# Patient Record
Sex: Male | Born: 1950
Health system: Southern US, Community
[De-identification: ages and names within clinical notes are randomized; demographics above are authoritative.]

## PROBLEM LIST (undated history)

## (undated) DIAGNOSIS — I10 Essential (primary) hypertension: Secondary | ICD-10-CM

## (undated) HISTORY — PX: CHOLECYSTECTOMY: SHX55

---

## 2005-06-17 DIAGNOSIS — M1990 Unspecified osteoarthritis, unspecified site: Secondary | ICD-10-CM | POA: Insufficient documentation

## 2005-06-17 DIAGNOSIS — E782 Mixed hyperlipidemia: Secondary | ICD-10-CM | POA: Insufficient documentation

## 2011-02-08 ENCOUNTER — Emergency Department: Payer: Self-pay | Admitting: Emergency Medicine

## 2011-08-14 DIAGNOSIS — E291 Testicular hypofunction: Secondary | ICD-10-CM | POA: Insufficient documentation

## 2012-05-01 ENCOUNTER — Emergency Department: Payer: Self-pay | Admitting: Emergency Medicine

## 2012-05-01 LAB — URINALYSIS, COMPLETE
Bacteria: NONE SEEN
Bilirubin,UR: NEGATIVE
Blood: NEGATIVE
Glucose,UR: NEGATIVE mg/dL (ref 0–75)
Ketone: NEGATIVE
Leukocyte Esterase: NEGATIVE
Nitrite: NEGATIVE
Protein: NEGATIVE
RBC,UR: NONE SEEN /HPF (ref 0–5)
WBC UR: 1 /HPF (ref 0–5)

## 2012-05-01 LAB — COMPREHENSIVE METABOLIC PANEL
Albumin: 3.9 g/dL (ref 3.4–5.0)
Alkaline Phosphatase: 79 U/L (ref 50–136)
BUN: 6 mg/dL — ABNORMAL LOW (ref 7–18)
Calcium, Total: 8.1 mg/dL — ABNORMAL LOW (ref 8.5–10.1)
Chloride: 100 mmol/L (ref 98–107)
Co2: 28 mmol/L (ref 21–32)
Creatinine: 0.81 mg/dL (ref 0.60–1.30)
EGFR (African American): >60
Glucose: 79 mg/dL (ref 65–99)
Osmolality: 268 (ref 275–301)
SGOT(AST): 29 U/L (ref 15–37)
SGPT (ALT): 29 U/L

## 2012-05-01 LAB — CBC
HCT: 41.3 % (ref 40.0–52.0)
MCHC: 33.1 g/dL (ref 32.0–36.0)
MCV: 87 fL (ref 80–100)
Platelet: 213 10*3/uL (ref 150–440)
RBC: 4.73 10*6/uL (ref 4.40–5.90)
WBC: 8 10*3/uL (ref 3.8–10.6)

## 2012-05-01 LAB — CK TOTAL AND CKMB (NOT AT ARMC)
CK, Total: 122 U/L (ref 35–232)
CK-MB: 1.5 ng/mL (ref 0.5–3.6)

## 2013-05-11 DIAGNOSIS — K219 Gastro-esophageal reflux disease without esophagitis: Secondary | ICD-10-CM | POA: Insufficient documentation

## 2013-05-11 DIAGNOSIS — G44219 Episodic tension-type headache, not intractable: Secondary | ICD-10-CM | POA: Insufficient documentation

## 2013-08-02 DIAGNOSIS — G4700 Insomnia, unspecified: Secondary | ICD-10-CM | POA: Insufficient documentation

## 2013-09-11 DIAGNOSIS — I110 Hypertensive heart disease with heart failure: Secondary | ICD-10-CM | POA: Insufficient documentation

## 2013-09-11 DIAGNOSIS — I2510 Atherosclerotic heart disease of native coronary artery without angina pectoris: Secondary | ICD-10-CM | POA: Insufficient documentation

## 2015-03-02 DIAGNOSIS — G8929 Other chronic pain: Secondary | ICD-10-CM | POA: Insufficient documentation

## 2015-07-18 DIAGNOSIS — G4733 Obstructive sleep apnea (adult) (pediatric): Secondary | ICD-10-CM | POA: Insufficient documentation

## 2015-08-10 DIAGNOSIS — F332 Major depressive disorder, recurrent severe without psychotic features: Secondary | ICD-10-CM | POA: Insufficient documentation

## 2015-09-05 DIAGNOSIS — I5032 Chronic diastolic (congestive) heart failure: Secondary | ICD-10-CM | POA: Insufficient documentation

## 2015-11-26 DIAGNOSIS — K582 Mixed irritable bowel syndrome: Secondary | ICD-10-CM | POA: Insufficient documentation

## 2016-03-04 DIAGNOSIS — N529 Male erectile dysfunction, unspecified: Secondary | ICD-10-CM | POA: Insufficient documentation

## 2019-01-03 DIAGNOSIS — R05 Cough: Secondary | ICD-10-CM | POA: Diagnosis not present

## 2019-01-03 DIAGNOSIS — R062 Wheezing: Secondary | ICD-10-CM | POA: Diagnosis not present

## 2019-01-03 DIAGNOSIS — R110 Nausea: Secondary | ICD-10-CM | POA: Diagnosis not present

## 2019-01-03 DIAGNOSIS — J0100 Acute maxillary sinusitis, unspecified: Secondary | ICD-10-CM | POA: Diagnosis not present

## 2019-01-03 DIAGNOSIS — R109 Unspecified abdominal pain: Secondary | ICD-10-CM | POA: Diagnosis not present

## 2019-01-10 DIAGNOSIS — K589 Irritable bowel syndrome without diarrhea: Secondary | ICD-10-CM | POA: Diagnosis not present

## 2019-01-10 DIAGNOSIS — R69 Illness, unspecified: Secondary | ICD-10-CM | POA: Diagnosis not present

## 2019-01-17 DIAGNOSIS — I10 Essential (primary) hypertension: Secondary | ICD-10-CM | POA: Diagnosis not present

## 2019-01-17 DIAGNOSIS — I5032 Chronic diastolic (congestive) heart failure: Secondary | ICD-10-CM | POA: Diagnosis not present

## 2019-02-05 DIAGNOSIS — R51 Headache: Secondary | ICD-10-CM | POA: Diagnosis not present

## 2019-02-05 DIAGNOSIS — G4733 Obstructive sleep apnea (adult) (pediatric): Secondary | ICD-10-CM | POA: Diagnosis not present

## 2019-02-05 DIAGNOSIS — I10 Essential (primary) hypertension: Secondary | ICD-10-CM | POA: Diagnosis not present

## 2019-02-12 DIAGNOSIS — R69 Illness, unspecified: Secondary | ICD-10-CM | POA: Diagnosis not present

## 2019-02-12 DIAGNOSIS — G4733 Obstructive sleep apnea (adult) (pediatric): Secondary | ICD-10-CM | POA: Diagnosis not present

## 2019-02-12 DIAGNOSIS — G4451 Hemicrania continua: Secondary | ICD-10-CM | POA: Diagnosis not present

## 2019-02-16 DIAGNOSIS — G4733 Obstructive sleep apnea (adult) (pediatric): Secondary | ICD-10-CM | POA: Diagnosis not present

## 2019-02-26 DIAGNOSIS — I10 Essential (primary) hypertension: Secondary | ICD-10-CM | POA: Diagnosis not present

## 2019-02-26 DIAGNOSIS — I5032 Chronic diastolic (congestive) heart failure: Secondary | ICD-10-CM | POA: Diagnosis not present

## 2019-04-18 DIAGNOSIS — H9193 Unspecified hearing loss, bilateral: Secondary | ICD-10-CM | POA: Diagnosis not present

## 2019-04-23 DIAGNOSIS — I10 Essential (primary) hypertension: Secondary | ICD-10-CM | POA: Diagnosis not present

## 2019-04-23 DIAGNOSIS — I5032 Chronic diastolic (congestive) heart failure: Secondary | ICD-10-CM | POA: Diagnosis not present

## 2019-05-07 DIAGNOSIS — I5032 Chronic diastolic (congestive) heart failure: Secondary | ICD-10-CM | POA: Diagnosis not present

## 2019-05-07 DIAGNOSIS — R69 Illness, unspecified: Secondary | ICD-10-CM | POA: Diagnosis not present

## 2019-05-14 DIAGNOSIS — G4733 Obstructive sleep apnea (adult) (pediatric): Secondary | ICD-10-CM | POA: Diagnosis not present

## 2019-05-27 DIAGNOSIS — Z7982 Long term (current) use of aspirin: Secondary | ICD-10-CM | POA: Diagnosis not present

## 2019-05-27 DIAGNOSIS — Z4682 Encounter for fitting and adjustment of non-vascular catheter: Secondary | ICD-10-CM | POA: Diagnosis not present

## 2019-05-27 DIAGNOSIS — K565 Intestinal adhesions [bands], unspecified as to partial versus complete obstruction: Secondary | ICD-10-CM | POA: Diagnosis not present

## 2019-05-27 DIAGNOSIS — R1011 Right upper quadrant pain: Secondary | ICD-10-CM | POA: Diagnosis not present

## 2019-05-27 DIAGNOSIS — G4733 Obstructive sleep apnea (adult) (pediatric): Secondary | ICD-10-CM | POA: Diagnosis not present

## 2019-05-27 DIAGNOSIS — Z978 Presence of other specified devices: Secondary | ICD-10-CM | POA: Diagnosis not present

## 2019-05-27 DIAGNOSIS — E782 Mixed hyperlipidemia: Secondary | ICD-10-CM | POA: Diagnosis not present

## 2019-05-27 DIAGNOSIS — R112 Nausea with vomiting, unspecified: Secondary | ICD-10-CM | POA: Diagnosis not present

## 2019-05-27 DIAGNOSIS — K56600 Partial intestinal obstruction, unspecified as to cause: Secondary | ICD-10-CM | POA: Diagnosis not present

## 2019-05-27 DIAGNOSIS — K5669 Other partial intestinal obstruction: Secondary | ICD-10-CM | POA: Diagnosis not present

## 2019-05-27 DIAGNOSIS — I10 Essential (primary) hypertension: Secondary | ICD-10-CM | POA: Diagnosis not present

## 2019-05-27 DIAGNOSIS — K219 Gastro-esophageal reflux disease without esophagitis: Secondary | ICD-10-CM | POA: Diagnosis not present

## 2019-05-27 DIAGNOSIS — I2510 Atherosclerotic heart disease of native coronary artery without angina pectoris: Secondary | ICD-10-CM | POA: Diagnosis not present

## 2019-05-27 DIAGNOSIS — R140 Abdominal distension (gaseous): Secondary | ICD-10-CM | POA: Diagnosis not present

## 2019-05-27 DIAGNOSIS — R69 Illness, unspecified: Secondary | ICD-10-CM | POA: Diagnosis not present

## 2019-05-30 DIAGNOSIS — I10 Essential (primary) hypertension: Secondary | ICD-10-CM | POA: Diagnosis not present

## 2019-05-30 DIAGNOSIS — K5669 Other partial intestinal obstruction: Secondary | ICD-10-CM | POA: Diagnosis not present

## 2019-05-30 DIAGNOSIS — K219 Gastro-esophageal reflux disease without esophagitis: Secondary | ICD-10-CM | POA: Diagnosis not present

## 2019-05-30 DIAGNOSIS — I2510 Atherosclerotic heart disease of native coronary artery without angina pectoris: Secondary | ICD-10-CM | POA: Diagnosis not present

## 2019-06-05 DIAGNOSIS — R109 Unspecified abdominal pain: Secondary | ICD-10-CM | POA: Diagnosis not present

## 2019-06-05 DIAGNOSIS — I10 Essential (primary) hypertension: Secondary | ICD-10-CM | POA: Diagnosis not present

## 2019-06-21 DIAGNOSIS — R350 Frequency of micturition: Secondary | ICD-10-CM | POA: Diagnosis not present

## 2019-06-21 DIAGNOSIS — N401 Enlarged prostate with lower urinary tract symptoms: Secondary | ICD-10-CM | POA: Diagnosis not present

## 2019-06-22 DIAGNOSIS — R350 Frequency of micturition: Secondary | ICD-10-CM | POA: Diagnosis not present

## 2019-06-22 DIAGNOSIS — R109 Unspecified abdominal pain: Secondary | ICD-10-CM | POA: Diagnosis not present

## 2019-06-22 DIAGNOSIS — N281 Cyst of kidney, acquired: Secondary | ICD-10-CM | POA: Diagnosis not present

## 2019-06-22 DIAGNOSIS — N401 Enlarged prostate with lower urinary tract symptoms: Secondary | ICD-10-CM | POA: Diagnosis not present

## 2019-06-22 DIAGNOSIS — K769 Liver disease, unspecified: Secondary | ICD-10-CM | POA: Diagnosis not present

## 2019-06-22 DIAGNOSIS — J9811 Atelectasis: Secondary | ICD-10-CM | POA: Diagnosis not present

## 2019-06-22 DIAGNOSIS — M479 Spondylosis, unspecified: Secondary | ICD-10-CM | POA: Diagnosis not present

## 2019-06-22 DIAGNOSIS — N329 Bladder disorder, unspecified: Secondary | ICD-10-CM | POA: Diagnosis not present

## 2019-06-28 DIAGNOSIS — R350 Frequency of micturition: Secondary | ICD-10-CM | POA: Diagnosis not present

## 2019-06-28 DIAGNOSIS — I5032 Chronic diastolic (congestive) heart failure: Secondary | ICD-10-CM | POA: Diagnosis not present

## 2019-06-28 DIAGNOSIS — R42 Dizziness and giddiness: Secondary | ICD-10-CM | POA: Diagnosis not present

## 2019-06-28 DIAGNOSIS — H9193 Unspecified hearing loss, bilateral: Secondary | ICD-10-CM | POA: Diagnosis not present

## 2019-06-28 DIAGNOSIS — I10 Essential (primary) hypertension: Secondary | ICD-10-CM | POA: Diagnosis not present

## 2019-07-13 DIAGNOSIS — R42 Dizziness and giddiness: Secondary | ICD-10-CM | POA: Diagnosis not present

## 2019-07-13 DIAGNOSIS — N529 Male erectile dysfunction, unspecified: Secondary | ICD-10-CM | POA: Diagnosis not present

## 2019-07-13 DIAGNOSIS — R1011 Right upper quadrant pain: Secondary | ICD-10-CM | POA: Diagnosis not present

## 2019-07-13 DIAGNOSIS — H9193 Unspecified hearing loss, bilateral: Secondary | ICD-10-CM | POA: Diagnosis not present

## 2019-07-13 DIAGNOSIS — Z125 Encounter for screening for malignant neoplasm of prostate: Secondary | ICD-10-CM | POA: Diagnosis not present

## 2019-07-13 DIAGNOSIS — M79604 Pain in right leg: Secondary | ICD-10-CM | POA: Diagnosis not present

## 2019-07-25 DIAGNOSIS — K315 Obstruction of duodenum: Secondary | ICD-10-CM | POA: Diagnosis not present

## 2019-07-25 DIAGNOSIS — R1013 Epigastric pain: Secondary | ICD-10-CM | POA: Diagnosis not present

## 2019-07-31 DIAGNOSIS — M1990 Unspecified osteoarthritis, unspecified site: Secondary | ICD-10-CM | POA: Diagnosis not present

## 2019-07-31 DIAGNOSIS — R69 Illness, unspecified: Secondary | ICD-10-CM | POA: Diagnosis not present

## 2019-08-03 DIAGNOSIS — H04123 Dry eye syndrome of bilateral lacrimal glands: Secondary | ICD-10-CM | POA: Diagnosis not present

## 2019-08-03 DIAGNOSIS — H524 Presbyopia: Secondary | ICD-10-CM | POA: Diagnosis not present

## 2019-08-03 DIAGNOSIS — H11001 Unspecified pterygium of right eye: Secondary | ICD-10-CM | POA: Diagnosis not present

## 2019-08-03 DIAGNOSIS — H35373 Puckering of macula, bilateral: Secondary | ICD-10-CM | POA: Diagnosis not present

## 2019-08-10 DIAGNOSIS — Z0100 Encounter for examination of eyes and vision without abnormal findings: Secondary | ICD-10-CM | POA: Diagnosis not present

## 2019-08-22 DIAGNOSIS — H9313 Tinnitus, bilateral: Secondary | ICD-10-CM | POA: Diagnosis not present

## 2019-08-22 DIAGNOSIS — Z77122 Contact with and (suspected) exposure to noise: Secondary | ICD-10-CM | POA: Diagnosis not present

## 2019-08-22 DIAGNOSIS — H9193 Unspecified hearing loss, bilateral: Secondary | ICD-10-CM | POA: Diagnosis not present

## 2019-08-22 DIAGNOSIS — L298 Other pruritus: Secondary | ICD-10-CM | POA: Diagnosis not present

## 2019-08-22 DIAGNOSIS — Z01118 Encounter for examination of ears and hearing with other abnormal findings: Secondary | ICD-10-CM | POA: Diagnosis not present

## 2019-08-22 DIAGNOSIS — H903 Sensorineural hearing loss, bilateral: Secondary | ICD-10-CM | POA: Diagnosis not present

## 2019-08-23 DIAGNOSIS — G4733 Obstructive sleep apnea (adult) (pediatric): Secondary | ICD-10-CM | POA: Diagnosis not present

## 2019-08-24 DIAGNOSIS — Z1159 Encounter for screening for other viral diseases: Secondary | ICD-10-CM | POA: Diagnosis not present

## 2019-08-27 DIAGNOSIS — I10 Essential (primary) hypertension: Secondary | ICD-10-CM | POA: Diagnosis not present

## 2019-08-27 DIAGNOSIS — H9190 Unspecified hearing loss, unspecified ear: Secondary | ICD-10-CM | POA: Diagnosis not present

## 2019-08-27 DIAGNOSIS — E782 Mixed hyperlipidemia: Secondary | ICD-10-CM | POA: Diagnosis not present

## 2019-08-27 DIAGNOSIS — R933 Abnormal findings on diagnostic imaging of other parts of digestive tract: Secondary | ICD-10-CM | POA: Diagnosis not present

## 2019-08-27 DIAGNOSIS — R1013 Epigastric pain: Secondary | ICD-10-CM | POA: Diagnosis not present

## 2019-08-27 DIAGNOSIS — I2510 Atherosclerotic heart disease of native coronary artery without angina pectoris: Secondary | ICD-10-CM | POA: Diagnosis not present

## 2019-08-27 DIAGNOSIS — E669 Obesity, unspecified: Secondary | ICD-10-CM | POA: Diagnosis not present

## 2019-08-27 DIAGNOSIS — R69 Illness, unspecified: Secondary | ICD-10-CM | POA: Diagnosis not present

## 2019-08-27 DIAGNOSIS — K2950 Unspecified chronic gastritis without bleeding: Secondary | ICD-10-CM | POA: Diagnosis not present

## 2019-08-27 DIAGNOSIS — K3189 Other diseases of stomach and duodenum: Secondary | ICD-10-CM | POA: Diagnosis not present

## 2019-08-27 DIAGNOSIS — G4733 Obstructive sleep apnea (adult) (pediatric): Secondary | ICD-10-CM | POA: Diagnosis not present

## 2019-08-27 DIAGNOSIS — K2930 Chronic superficial gastritis without bleeding: Secondary | ICD-10-CM | POA: Diagnosis not present

## 2019-09-19 DIAGNOSIS — I5032 Chronic diastolic (congestive) heart failure: Secondary | ICD-10-CM | POA: Diagnosis not present

## 2019-09-19 DIAGNOSIS — I10 Essential (primary) hypertension: Secondary | ICD-10-CM | POA: Diagnosis not present

## 2019-09-19 DIAGNOSIS — G8929 Other chronic pain: Secondary | ICD-10-CM | POA: Diagnosis not present

## 2019-09-19 DIAGNOSIS — R69 Illness, unspecified: Secondary | ICD-10-CM | POA: Diagnosis not present

## 2019-09-28 DIAGNOSIS — I5032 Chronic diastolic (congestive) heart failure: Secondary | ICD-10-CM | POA: Diagnosis not present

## 2019-09-28 DIAGNOSIS — M5441 Lumbago with sciatica, right side: Secondary | ICD-10-CM | POA: Diagnosis not present

## 2019-09-28 DIAGNOSIS — G8929 Other chronic pain: Secondary | ICD-10-CM | POA: Diagnosis not present

## 2019-10-08 DIAGNOSIS — R61 Generalized hyperhidrosis: Secondary | ICD-10-CM | POA: Diagnosis not present

## 2019-10-08 DIAGNOSIS — M79604 Pain in right leg: Secondary | ICD-10-CM | POA: Diagnosis not present

## 2019-10-08 DIAGNOSIS — G8929 Other chronic pain: Secondary | ICD-10-CM | POA: Diagnosis not present

## 2019-10-08 DIAGNOSIS — M5441 Lumbago with sciatica, right side: Secondary | ICD-10-CM | POA: Diagnosis not present

## 2019-10-08 DIAGNOSIS — Z23 Encounter for immunization: Secondary | ICD-10-CM | POA: Diagnosis not present

## 2019-10-30 DIAGNOSIS — M5441 Lumbago with sciatica, right side: Secondary | ICD-10-CM | POA: Diagnosis not present

## 2019-10-30 DIAGNOSIS — G8929 Other chronic pain: Secondary | ICD-10-CM | POA: Diagnosis not present

## 2019-10-30 DIAGNOSIS — I5032 Chronic diastolic (congestive) heart failure: Secondary | ICD-10-CM | POA: Diagnosis not present

## 2019-11-02 DIAGNOSIS — R1013 Epigastric pain: Secondary | ICD-10-CM | POA: Diagnosis not present

## 2019-11-02 DIAGNOSIS — M5431 Sciatica, right side: Secondary | ICD-10-CM | POA: Diagnosis not present

## 2019-11-02 DIAGNOSIS — M5137 Other intervertebral disc degeneration, lumbosacral region: Secondary | ICD-10-CM | POA: Diagnosis not present

## 2019-11-02 DIAGNOSIS — K5900 Constipation, unspecified: Secondary | ICD-10-CM | POA: Diagnosis not present

## 2019-11-05 DIAGNOSIS — M5441 Lumbago with sciatica, right side: Secondary | ICD-10-CM | POA: Diagnosis not present

## 2019-11-05 DIAGNOSIS — M79604 Pain in right leg: Secondary | ICD-10-CM | POA: Diagnosis not present

## 2019-11-05 DIAGNOSIS — R61 Generalized hyperhidrosis: Secondary | ICD-10-CM | POA: Diagnosis not present

## 2019-11-05 DIAGNOSIS — G44219 Episodic tension-type headache, not intractable: Secondary | ICD-10-CM | POA: Diagnosis not present

## 2019-11-05 DIAGNOSIS — G43909 Migraine, unspecified, not intractable, without status migrainosus: Secondary | ICD-10-CM | POA: Insufficient documentation

## 2019-11-05 DIAGNOSIS — G8929 Other chronic pain: Secondary | ICD-10-CM | POA: Diagnosis not present

## 2019-11-21 DIAGNOSIS — M79604 Pain in right leg: Secondary | ICD-10-CM | POA: Diagnosis not present

## 2019-11-21 DIAGNOSIS — G8929 Other chronic pain: Secondary | ICD-10-CM | POA: Diagnosis not present

## 2019-11-21 DIAGNOSIS — M5441 Lumbago with sciatica, right side: Secondary | ICD-10-CM | POA: Diagnosis not present

## 2019-11-30 DIAGNOSIS — M79604 Pain in right leg: Secondary | ICD-10-CM | POA: Diagnosis not present

## 2019-11-30 DIAGNOSIS — M5441 Lumbago with sciatica, right side: Secondary | ICD-10-CM | POA: Diagnosis not present

## 2019-11-30 DIAGNOSIS — G8929 Other chronic pain: Secondary | ICD-10-CM | POA: Diagnosis not present

## 2019-12-07 DIAGNOSIS — G8929 Other chronic pain: Secondary | ICD-10-CM | POA: Diagnosis not present

## 2019-12-07 DIAGNOSIS — M5441 Lumbago with sciatica, right side: Secondary | ICD-10-CM | POA: Diagnosis not present

## 2019-12-07 DIAGNOSIS — M79604 Pain in right leg: Secondary | ICD-10-CM | POA: Diagnosis not present

## 2019-12-10 DIAGNOSIS — M5441 Lumbago with sciatica, right side: Secondary | ICD-10-CM | POA: Diagnosis not present

## 2019-12-10 DIAGNOSIS — I5032 Chronic diastolic (congestive) heart failure: Secondary | ICD-10-CM | POA: Diagnosis not present

## 2019-12-10 DIAGNOSIS — G8929 Other chronic pain: Secondary | ICD-10-CM | POA: Diagnosis not present

## 2019-12-14 DIAGNOSIS — G8929 Other chronic pain: Secondary | ICD-10-CM | POA: Diagnosis not present

## 2019-12-14 DIAGNOSIS — M5441 Lumbago with sciatica, right side: Secondary | ICD-10-CM | POA: Diagnosis not present

## 2019-12-14 DIAGNOSIS — M79604 Pain in right leg: Secondary | ICD-10-CM | POA: Diagnosis not present

## 2019-12-27 DIAGNOSIS — G8929 Other chronic pain: Secondary | ICD-10-CM | POA: Diagnosis not present

## 2019-12-27 DIAGNOSIS — M5441 Lumbago with sciatica, right side: Secondary | ICD-10-CM | POA: Diagnosis not present

## 2019-12-27 DIAGNOSIS — M79604 Pain in right leg: Secondary | ICD-10-CM | POA: Diagnosis not present

## 2020-01-03 DIAGNOSIS — M79604 Pain in right leg: Secondary | ICD-10-CM | POA: Diagnosis not present

## 2020-01-03 DIAGNOSIS — G8929 Other chronic pain: Secondary | ICD-10-CM | POA: Diagnosis not present

## 2020-01-03 DIAGNOSIS — M5441 Lumbago with sciatica, right side: Secondary | ICD-10-CM | POA: Diagnosis not present

## 2020-01-10 DIAGNOSIS — H905 Unspecified sensorineural hearing loss: Secondary | ICD-10-CM | POA: Diagnosis not present

## 2020-01-10 DIAGNOSIS — Z461 Encounter for fitting and adjustment of hearing aid: Secondary | ICD-10-CM | POA: Diagnosis not present

## 2020-01-17 DIAGNOSIS — M5441 Lumbago with sciatica, right side: Secondary | ICD-10-CM | POA: Diagnosis not present

## 2020-01-17 DIAGNOSIS — M79604 Pain in right leg: Secondary | ICD-10-CM | POA: Diagnosis not present

## 2020-01-17 DIAGNOSIS — G8929 Other chronic pain: Secondary | ICD-10-CM | POA: Diagnosis not present

## 2020-01-18 DIAGNOSIS — G4733 Obstructive sleep apnea (adult) (pediatric): Secondary | ICD-10-CM | POA: Diagnosis not present

## 2020-01-22 DIAGNOSIS — G4733 Obstructive sleep apnea (adult) (pediatric): Secondary | ICD-10-CM | POA: Diagnosis not present

## 2020-01-24 DIAGNOSIS — G8929 Other chronic pain: Secondary | ICD-10-CM | POA: Diagnosis not present

## 2020-01-24 DIAGNOSIS — M5441 Lumbago with sciatica, right side: Secondary | ICD-10-CM | POA: Diagnosis not present

## 2020-01-24 DIAGNOSIS — M79604 Pain in right leg: Secondary | ICD-10-CM | POA: Diagnosis not present

## 2020-01-25 ENCOUNTER — Observation Stay: Payer: Medicare HMO

## 2020-01-25 ENCOUNTER — Emergency Department: Payer: Medicare HMO

## 2020-01-25 ENCOUNTER — Observation Stay
Admission: EM | Admit: 2020-01-25 | Discharge: 2020-01-26 | Disposition: A | Discharge status: Home / Self Care | Payer: Medicare HMO | Attending: Family Medicine | Admitting: Family Medicine

## 2020-01-25 ENCOUNTER — Other Ambulatory Visit: Payer: Self-pay

## 2020-01-25 DIAGNOSIS — Z20822 Contact with and (suspected) exposure to covid-19: Secondary | ICD-10-CM | POA: Insufficient documentation

## 2020-01-25 DIAGNOSIS — W19XXXA Unspecified fall, initial encounter: Secondary | ICD-10-CM | POA: Diagnosis not present

## 2020-01-25 DIAGNOSIS — M5441 Lumbago with sciatica, right side: Secondary | ICD-10-CM | POA: Diagnosis not present

## 2020-01-25 DIAGNOSIS — S299XXA Unspecified injury of thorax, initial encounter: Secondary | ICD-10-CM | POA: Diagnosis not present

## 2020-01-25 DIAGNOSIS — R29898 Other symptoms and signs involving the musculoskeletal system: Secondary | ICD-10-CM | POA: Diagnosis present

## 2020-01-25 DIAGNOSIS — E785 Hyperlipidemia, unspecified: Secondary | ICD-10-CM | POA: Insufficient documentation

## 2020-01-25 DIAGNOSIS — R531 Weakness: Principal | ICD-10-CM | POA: Insufficient documentation

## 2020-01-25 DIAGNOSIS — I10 Essential (primary) hypertension: Secondary | ICD-10-CM | POA: Diagnosis not present

## 2020-01-25 DIAGNOSIS — F329 Major depressive disorder, single episode, unspecified: Secondary | ICD-10-CM | POA: Diagnosis not present

## 2020-01-25 DIAGNOSIS — R4702 Dysphasia: Secondary | ICD-10-CM

## 2020-01-25 DIAGNOSIS — Z79899 Other long term (current) drug therapy: Secondary | ICD-10-CM | POA: Insufficient documentation

## 2020-01-25 DIAGNOSIS — G8929 Other chronic pain: Secondary | ICD-10-CM | POA: Insufficient documentation

## 2020-01-25 DIAGNOSIS — Z955 Presence of coronary angioplasty implant and graft: Secondary | ICD-10-CM | POA: Diagnosis not present

## 2020-01-25 DIAGNOSIS — S0990XA Unspecified injury of head, initial encounter: Secondary | ICD-10-CM | POA: Diagnosis not present

## 2020-01-25 DIAGNOSIS — R001 Bradycardia, unspecified: Secondary | ICD-10-CM | POA: Diagnosis not present

## 2020-01-25 DIAGNOSIS — I959 Hypotension, unspecified: Secondary | ICD-10-CM | POA: Diagnosis not present

## 2020-01-25 DIAGNOSIS — G43909 Migraine, unspecified, not intractable, without status migrainosus: Secondary | ICD-10-CM | POA: Insufficient documentation

## 2020-01-25 DIAGNOSIS — S79911A Unspecified injury of right hip, initial encounter: Secondary | ICD-10-CM | POA: Diagnosis not present

## 2020-01-25 DIAGNOSIS — Z7982 Long term (current) use of aspirin: Secondary | ICD-10-CM | POA: Diagnosis not present

## 2020-01-25 DIAGNOSIS — R269 Unspecified abnormalities of gait and mobility: Secondary | ICD-10-CM | POA: Diagnosis not present

## 2020-01-25 DIAGNOSIS — I2510 Atherosclerotic heart disease of native coronary artery without angina pectoris: Secondary | ICD-10-CM

## 2020-01-25 DIAGNOSIS — R55 Syncope and collapse: Secondary | ICD-10-CM | POA: Diagnosis not present

## 2020-01-25 DIAGNOSIS — E860 Dehydration: Secondary | ICD-10-CM | POA: Diagnosis not present

## 2020-01-25 DIAGNOSIS — R5381 Other malaise: Secondary | ICD-10-CM | POA: Diagnosis not present

## 2020-01-25 DIAGNOSIS — R42 Dizziness and giddiness: Secondary | ICD-10-CM | POA: Diagnosis not present

## 2020-01-25 DIAGNOSIS — N400 Enlarged prostate without lower urinary tract symptoms: Secondary | ICD-10-CM | POA: Diagnosis not present

## 2020-01-25 DIAGNOSIS — K219 Gastro-esophageal reflux disease without esophagitis: Secondary | ICD-10-CM | POA: Insufficient documentation

## 2020-01-25 DIAGNOSIS — I495 Sick sinus syndrome: Secondary | ICD-10-CM

## 2020-01-25 DIAGNOSIS — I440 Atrioventricular block, first degree: Secondary | ICD-10-CM | POA: Insufficient documentation

## 2020-01-25 DIAGNOSIS — I442 Atrioventricular block, complete: Secondary | ICD-10-CM

## 2020-01-25 HISTORY — DX: Essential (primary) hypertension: I10

## 2020-01-25 LAB — BASIC METABOLIC PANEL
Anion gap: 6 (ref 5–15)
BUN: 18 mg/dL (ref 8–23)
CO2: 26 mmol/L (ref 22–32)
Calcium: 8.4 mg/dL — ABNORMAL LOW (ref 8.9–10.3)
Chloride: 105 mmol/L (ref 98–111)
Creatinine, Ser: 0.94 mg/dL (ref 0.61–1.24)
GFR calc Af Amer: >60 mL/min (ref >60)
GFR calc non Af Amer: >60 mL/min (ref >60)
Glucose, Bld: 125 mg/dL — ABNORMAL HIGH (ref 70–99)
Potassium: 3.9 mmol/L (ref 3.5–5.1)
Sodium: 137 mmol/L (ref 135–145)

## 2020-01-25 LAB — RESPIRATORY PANEL BY RT PCR (FLU A&B, COVID)
Influenza A by PCR: NEGATIVE
Influenza B by PCR: NEGATIVE
SARS Coronavirus 2 by RT PCR: NEGATIVE

## 2020-01-25 LAB — URINALYSIS, COMPLETE (UACMP) WITH MICROSCOPIC
Bacteria, UA: NONE SEEN
Bilirubin Urine: NEGATIVE
Glucose, UA: NEGATIVE mg/dL
Hgb urine dipstick: NEGATIVE
Ketones, ur: NEGATIVE mg/dL
Leukocytes,Ua: NEGATIVE
Nitrite: NEGATIVE
Protein, ur: NEGATIVE mg/dL
Specific Gravity, Urine: 1.004 — ABNORMAL LOW (ref 1.005–1.030)
Squamous Epithelial / HPF: NONE SEEN (ref 0–5)
pH: 6 (ref 5.0–8.0)

## 2020-01-25 LAB — URINE DRUG SCREEN, QUALITATIVE (ARMC ONLY)
Amphetamines, Ur Screen: NOT DETECTED
Barbiturates, Ur Screen: NOT DETECTED
Benzodiazepine, Ur Scrn: NOT DETECTED
Cannabinoid 50 Ng, Ur ~~LOC~~: NOT DETECTED
Cocaine Metabolite,Ur ~~LOC~~: NOT DETECTED
MDMA (Ecstasy)Ur Screen: NOT DETECTED
Methadone Scn, Ur: NOT DETECTED
Opiate, Ur Screen: NOT DETECTED
Phencyclidine (PCP) Ur S: NOT DETECTED
Tricyclic, Ur Screen: NOT DETECTED

## 2020-01-25 LAB — CBC
HCT: 39.3 % (ref 39.0–52.0)
Hemoglobin: 13 g/dL (ref 13.0–17.0)
MCH: 29.5 pg (ref 26.0–34.0)
MCHC: 33.1 g/dL (ref 30.0–36.0)
MCV: 89.1 fL (ref 80.0–100.0)
Platelets: 135 10*3/uL — ABNORMAL LOW (ref 150–400)
RBC: 4.41 MIL/uL (ref 4.22–5.81)
RDW: 13.9 % (ref 11.5–15.5)
WBC: 6.2 10*3/uL (ref 4.0–10.5)
nRBC: 0 % (ref 0.0–0.2)

## 2020-01-25 LAB — TROPONIN I (HIGH SENSITIVITY)
Troponin I (High Sensitivity): 3 ng/L (ref <18)
Troponin I (High Sensitivity): 4 ng/L (ref <18)

## 2020-01-25 LAB — MAGNESIUM: Magnesium: 2.4 mg/dL (ref 1.7–2.4)

## 2020-01-25 MED ORDER — ACETAMINOPHEN 650 MG RE SUPP: 650.0000 mg | RECTAL | Status: DC | PRN

## 2020-01-25 MED ORDER — ACETAMINOPHEN 160 MG/5ML PO SOLN
650.0000 mg | ORAL | Status: DC | PRN
  Filled 2020-01-25: qty 20.3

## 2020-01-25 MED ORDER — DICLOFENAC SODIUM 1 % EX GEL
2.0000 g | Freq: Four times a day (QID) | CUTANEOUS | Status: DC | PRN
  Filled 2020-01-25: qty 100

## 2020-01-25 MED ORDER — ISOSORBIDE MONONITRATE ER 30 MG PO TB24
60.0000 mg | ORAL_TABLET | Freq: Every day | ORAL | Status: DC
  Administered 2020-01-26: 60 mg via ORAL
  Filled 2020-01-25 (×2): qty 2

## 2020-01-25 MED ORDER — STROKE: EARLY STAGES OF RECOVERY BOOK: Freq: Once | Status: AC

## 2020-01-25 MED ORDER — SENNOSIDES-DOCUSATE SODIUM 8.6-50 MG PO TABS: 1.0000 | ORAL_TABLET | Freq: Every evening | ORAL | Status: DC | PRN

## 2020-01-25 MED ORDER — CLOPIDOGREL 5 MG/ML PEDIATRIC ORAL SUSPENSION: 75.0000 mg | Freq: Every day | ORAL | Status: DC

## 2020-01-25 MED ORDER — ASPIRIN EC 81 MG PO TBEC
81.0000 mg | DELAYED_RELEASE_TABLET | Freq: Every day | ORAL | Status: DC
  Administered 2020-01-26: 81 mg via ORAL
  Filled 2020-01-25: qty 1

## 2020-01-25 MED ORDER — ALBUTEROL SULFATE (2.5 MG/3ML) 0.083% IN NEBU: 2.5000 mg | INHALATION_SOLUTION | Freq: Four times a day (QID) | RESPIRATORY_TRACT | Status: DC | PRN

## 2020-01-25 MED ORDER — SODIUM CHLORIDE 0.9 % IV BOLUS
1000.0000 mL | Freq: Once | INTRAVENOUS | Status: AC
  Administered 2020-01-25: 1000 mL via INTRAVENOUS

## 2020-01-25 MED ORDER — LOSARTAN POTASSIUM 50 MG PO TABS
100.0000 mg | ORAL_TABLET | Freq: Every day | ORAL | Status: DC
  Administered 2020-01-26: 100 mg via ORAL
  Filled 2020-01-25: qty 2

## 2020-01-25 MED ORDER — METOPROLOL SUCCINATE ER 25 MG PO TB24: 25.0000 mg | ORAL_TABLET | Freq: Every day | ORAL | Status: DC

## 2020-01-25 MED ORDER — ASPIRIN EC 325 MG PO TBEC: 325.0000 mg | DELAYED_RELEASE_TABLET | Freq: Every day | ORAL | Status: DC

## 2020-01-25 MED ORDER — PANTOPRAZOLE SODIUM 40 MG PO TBEC
40.0000 mg | DELAYED_RELEASE_TABLET | Freq: Every day | ORAL | Status: DC
  Administered 2020-01-26: 40 mg via ORAL
  Filled 2020-01-25: qty 1

## 2020-01-25 MED ORDER — INFLUENZA VAC A&B SA ADJ QUAD 0.5 ML IM PRSY
0.5000 mL | PREFILLED_SYRINGE | INTRAMUSCULAR | Status: DC
  Filled 2020-01-25: qty 0.5

## 2020-01-25 MED ORDER — ASPIRIN 300 MG RE SUPP: 300.0000 mg | Freq: Every day | RECTAL | Status: DC

## 2020-01-25 MED ORDER — SODIUM CHLORIDE 0.9 % IV SOLN: INTRAVENOUS | Status: DC

## 2020-01-25 MED ORDER — FLUTICASONE PROPIONATE 50 MCG/ACT NA SUSP
1.0000 | Freq: Every day | NASAL | Status: DC
  Administered 2020-01-26: 1 via NASAL
  Filled 2020-01-25: qty 16

## 2020-01-25 MED ORDER — ROSUVASTATIN CALCIUM 10 MG PO TABS
40.0000 mg | ORAL_TABLET | Freq: Every day | ORAL | Status: DC
  Administered 2020-01-26: 40 mg via ORAL
  Filled 2020-01-25: qty 4

## 2020-01-25 MED ORDER — PREGABALIN 75 MG PO CAPS
150.0000 mg | ORAL_CAPSULE | Freq: Two times a day (BID) | ORAL | Status: DC
  Administered 2020-01-26 (×2): 150 mg via ORAL
  Filled 2020-01-25 (×2): qty 2

## 2020-01-25 MED ORDER — ACETAMINOPHEN 325 MG PO TABS: 650.0000 mg | ORAL_TABLET | ORAL | Status: DC | PRN

## 2020-01-25 MED ORDER — SERTRALINE HCL 50 MG PO TABS
150.0000 mg | ORAL_TABLET | Freq: Every day | ORAL | Status: DC
  Administered 2020-01-26: 150 mg via ORAL
  Filled 2020-01-25: qty 3

## 2020-01-25 MED ORDER — ENOXAPARIN SODIUM 40 MG/0.4ML ~~LOC~~ SOLN
40.0000 mg | SUBCUTANEOUS | Status: DC
  Administered 2020-01-26: 40 mg via SUBCUTANEOUS
  Filled 2020-01-25: qty 0.4

## 2020-01-25 MED ORDER — BUPROPION HCL ER (XL) 150 MG PO TB24: 300.0000 mg | ORAL_TABLET | Freq: Every day | ORAL | Status: DC

## 2020-01-25 MED ORDER — HYDROXYZINE HCL 25 MG PO TABS
25.0000 mg | ORAL_TABLET | Freq: Three times a day (TID) | ORAL | Status: DC | PRN
  Filled 2020-01-25: qty 1

## 2020-01-25 MED ORDER — CLOPIDOGREL BISULFATE 75 MG PO TABS
75.0000 mg | ORAL_TABLET | Freq: Every day | ORAL | Status: DC
  Administered 2020-01-26: 75 mg via ORAL
  Filled 2020-01-25: qty 1

## 2020-01-25 NOTE — ED Notes (Signed)
This RN spoke with pt duaghter in law about pt admission and plan of care. Daughter in law denies any further questions at this time.

## 2020-01-25 NOTE — ED Provider Notes (Signed)
[facility] Emergency Department Provider Note  ____________________________________________   None    (approximate)  I have reviewed the triage vital signs and the nursing notes.  History  Chief Complaint Weakness    HPI Austin Wagner is a 69 y.o. male history migraines, HTN, CAD status post stenting, chronic right lower back pain with sciatica who presents emergency department for generalized weakness.    This morning when he woke up he felt generally weak, he was unable to walk due to his generalized weakness.  This prompted him to seek care.  Per EMS, vital signs were orthostatic positive, initial pressure 111/62 and then repeat 85/62 with standing.  Patient does report that he fell yesterday.  States he fell because his sciatica was bothering him and he was unable to ambulate due to the pain.  He denies any preceding loss of consciousness, chest pain, palpitations that caused the fall.  He fell forward hitting his head and chin area.  Denies any loss of consciousness subsequent to the fall.  Reports acute on chronic worsening of his right lower back pain and sciatica after the fall, but denies any other injuries.  States that it is a sharp pain, located to the right lower back and radiates down outside of his right leg.  Constant, no apparent alleviating components.   Patient denies any recent illnesses or infections.  No fevers, cough, nausea, vomiting, diarrhea, urinary symptoms.  No sick contacts.  According to daughter-in-law via phone, the patient is normally a little bit unsteady when he wakes up in the morning, but he usually improves throughout the day.  Today his weakness did not subside, which is unusual.  This more severe and new/acute weakness is very unusual for the patient.  Aside from the fall, and getting a new hearing aid, no other significant events have occurred within the last several days.  She denies any heavy alcohol use.  Tdap  up-to-date on chart review, last received 2018.  History obtained with the assistance of a Spanish interpreter.   Past Medical Hx Past Medical History:  Diagnosis Date  . Hypertension     Problem List There are no problems to display for this patient.   Past Surgical Hx History reviewed. No pertinent surgical history.  Medications Prior to Admission medications   Not on File    Allergies Flomax [tamsulosin]  Family Hx No family history on file.  Social Hx Social History   Tobacco Use  . Smoking status: Not on file  Substance Use Topics  . Alcohol use: Not on file  . Drug use: Not on file     Review of Systems  Constitutional: Negative for fever, chills. Eyes: Negative for visual changes. ENT: Negative for sore throat. Cardiovascular: Negative for chest pain. Respiratory: Negative for shortness of breath. Gastrointestinal: Negative for nausea, vomiting.  Genitourinary: Negative for dysuria. Musculoskeletal: +  R low back and leg pain, sciatica Skin: Negative for rash. Neurological: Negative for headaches.   Physical Exam  Vital Signs: ED Triage Vitals [01/25/20 1734]  Enc Vitals Group     BP      Pulse Rate (!) 55     Resp 15     Temp 97.9 F (36.6 C)     Temp Source Oral     SpO2 96 %     Weight 200 lb (90.7 kg)     Height 5\' 7"  (1.702 m)     Head Circumference  Peak Flow      Pain Score 8     Pain Loc      Pain Edu?      Excl. in GC?     Constitutional: Alert and oriented.  Head: Normocephalic.  Abrasion to the chin. Eyes: Conjunctivae clear. Sclera anicteric. Nose: No congestion. No rhinorrhea.  No epistaxis. Mouth/Throat: Wearing mask.  No intraoral or dental trauma. Neck: No stridor.  No midline CS tenderness. Cardiovascular: Normal rate, regular rhythm. Extremities well perfused. Respiratory: Normal respiratory effort.  Lungs CTAB. Gastrointestinal: Soft. Non-tender. Non-distended.  Musculoskeletal: No lower extremity  edema. No deformities.  FROM bilateral shoulders, elbows, wrists, hips, knees, ankles. Neurologic:  Normal speech and language. No gross focal neurologic deficits are appreciated. UE and LE strength 4+/5 throughout and symmetric. SILT. Unsteady w/ ambulation, see below. Skin: Abrasion to chin. Psychiatric: Mood and affect are appropriate for situation.  EKG  Personally reviewed.   Rate: 55 Rhythm: sinus Axis: normal Intervals: WNL No acute ischemic changes Sinus bradycardia No STEMI    Radiology  CT head: IMPRESSION:  1. Negative age non contrast CT appearance of the brain. No acute  traumatic injury identified.  2. Mild right sphenoid sinus inflammation.   CXR: IMPRESSION:  No acute cardiopulmonary abnormality or acute traumatic injury  identified.   XR RIGHT hip: IMPRESSION:  No acute fracture or dislocation identified about the right hip or  pelvis.    Procedures  Procedure(s) performed (including critical care):  Procedures   Initial Impression / Assessment and Plan / ED Course  69 y.o. male who presents to the ED for generalized weakness, as well as a fall yesterday with resultant acute on chronic sciatica.  Ddx: UTI, electrolyte abnormality, dehydration, anemia, COVID.  Do not suspect central cord as etiology for his weakness, given symptoms are primarily lower rather than upper, additionally he does not describe any hyperextension injury related to his fall. Will obtain labs for further evaluation, imaging to rule out any acute traumatic injuries.  Work-up without any identifiable etiology for the patient's weakness.  On ambulatory trial he is very unsteady and generally weak, requires nurse assist and leaning into the wall to ambulate.  This is not his baseline and seemingly new/acute per reports from family.  We will plan to admit for further work-up.  Discussed with hospitalist for admission.   Final Clinical Impression(s) / ED Diagnosis  Final  diagnoses:  Generalized weakness  Fall, initial encounter       Note:  This document was prepared using Dragon voice recognition software and may include unintentional dictation errors.   Miguel Aschoff., MD 01/26/20 [phone]

## 2020-01-25 NOTE — ED Notes (Signed)
This RN talked to pt daughter in law, Randa Evens on the phone with permission from pt.  Pt daughter in law was updated on pt status and plan of care. Pt daughter in law reported that pt has increased weakness starting this morning. States that pt is usually unsteady in the morning however today his weakness did not subside throughout the day like usual. This nurse told her that she would call with any updates.

## 2020-01-25 NOTE — ED Notes (Addendum)
This nurse assisted pt with ambulation to bathroom. Pt walked with unsteady gait and continuously leaned onto wall and this nurse for support. This nurse attempted to collect urine sample but pt was unable to catch urine in urine cup.

## 2020-01-25 NOTE — ED Notes (Signed)
Pt SpO2 is 100%, pulse oximeter was not reading at the time of these vitals.

## 2020-01-25 NOTE — H&P (Addendum)
[facility] [place] [facility]   PATIENT NAME: Austin Wagner    MR#:  [id number]  DATE OF BIRTH:  1951/06/08  DATE OF ADMISSION:  01/25/2020  PRIMARY CARE PHYSICIAN: Patient, No Pcp Per   REQUESTING/REFERRING PHYSICIAN: Paschal Dopp, MD CHIEF COMPLAINT:   Chief Complaint  Patient presents with  . Weakness  History is obtained via AMN  translator as the patient is only Spanish-speaking  HISTORY OF PRESENT ILLNESS:  Austin Wagner  is a 69 y.o. Hispanic male with a known history of hypertension, dyslipidemia, depression, coronary artery disease status post PCI and 3 stents, GERD and migraines, who presented to the emergency room with an onset of generalized weakness and fainting feeling.  He stated that he was not able to ambulate.  He had some dysphasia but denied dysphagia.  He has been having occasional headache and admits to dizziness without blurred vision or diplopia.  No chest pain or palpitations.  No nausea vomiting or abdominal pain.  No fever or chills.  He denied any urinary or stool incontinence.  He was not able to walk without assistance.  He denied any unilateral weakness however.  He had a fall yesterday without injuries.  Upon presentation to the emergency room, heart rate was 55 and otherwise vital signs were within normal.  Labs revealed unremarkable CBC and CMP.  Noncontrasted head CT scan showed mild right sphenoid sinus inflammation without acute intracranial normalities.  Chest x-ray showed no acute cardiopulmonary disease.  He was complaining of right hip pain and x-ray showed no acute abnormalities.  EKG showed sinus rhythm with rate of 55 with low voltage QRS.  His COVID-19 PCR test came back negative.  The patient was given 1 L bolus of IV normal saline.  He will be admitted to an observation medical monitored bed for further evaluation and monitoring.  PAST MEDICAL HISTORY:   Past Medical History:  Diagnosis Date  . Hypertension     Migraines, GERD, peripheral neuropathy, dyslipidemia, depression, BPH and coronary artery disease status post PCI and 3 stents.  PAST SURGICAL HISTORY:   Past Surgical History:  Procedure Laterality Date  . CHOLECYSTECTOMY    PCI and 3 stents.  SOCIAL HISTORY:   Social History   Tobacco Use  . Smoking status: Not on file  Substance Use Topics  . Alcohol use: Not on file  No history of tobacco or EtOH abuse or illicit drug use.  FAMILY HISTORY:  His father died from MI.  DRUG ALLERGIES:   Allergies  Allergen Reactions  . Flomax [Tamsulosin]     REVIEW OF SYSTEMS:   ROS As per history of present illness. All pertinent systems were reviewed above. Constitutional,  HEENT, cardiovascular, respiratory, GI, GU, musculoskeletal, neuro, psychiatric, endocrine,  integumentary and hematologic systems were reviewed and are otherwise  negative/unremarkable except for positive findings mentioned above in the HPI.   MEDICATIONS AT HOME:   Prior to Admission medications   Medication Sig Start Date End Date Taking? Authorizing Provider  albuterol (VENTOLIN HFA) 108 (90 Base) MCG/ACT inhaler Inhale 2 puffs into the lungs every 6 (six) hours as needed. 12/23/19  Yes [provider]  aspirin 81 MG EC tablet Take 1 tablet by mouth daily. 09/05/15  Yes [provider]  buPROPion (WELLBUTRIN XL) 300 MG 24 hr tablet Take 300 mg by mouth daily. 11/26/19  Yes [provider]  diclofenac Sodium (VOLTAREN) 1 % GEL Apply 2 g topically 4 (four) times daily. 12/15/19  Yes [provider]  fluticasone (FLONASE) 50 MCG/ACT nasal spray Place 1 spray into both nostrils daily. 11/17/19  Yes [provider]  hydrOXYzine (ATARAX/VISTARIL) 25 MG tablet Take 25 mg by mouth 3 (three) times daily as needed. 01/13/20  Yes [provider]  isosorbide mononitrate (IMDUR) 30 MG 24 hr tablet Take 60 mg by mouth daily. 12/28/19  Yes [provider]   losartan (COZAAR) 100 MG tablet Take 100 mg by mouth daily. 09/06/19  Yes [provider]  metoprolol succinate (TOPROL-XL) 25 MG 24 hr tablet Take 1 tablet by mouth daily. 01/24/19  Yes [provider]  naproxen (NAPROSYN) 500 MG tablet Take 500 mg by mouth 2 (two) times daily as needed. 12/23/19  Yes [provider]  omeprazole (PRILOSEC) 40 MG capsule Take 40 mg by mouth daily. 12/04/19  Yes [provider]  pregabalin (LYRICA) 150 MG capsule Take 150 mg by mouth 2 (two) times daily. 11/26/19  Yes [provider]  rosuvastatin (CRESTOR) 40 MG tablet Take 40 mg by mouth daily. 11/26/19  Yes [provider]  sertraline (ZOLOFT) 100 MG tablet Take 150 mg by mouth daily. 12/29/19  Yes [provider]  SUMAtriptan (IMITREX) 50 MG tablet Take 50 mg by mouth daily as needed. 11/05/19  Yes [provider]  omeprazole (PRILOSEC) 20 MG capsule Take 20 mg by mouth daily. 10/05/19   [provider]  tamsulosin (FLOMAX) 0.4 MG CAPS capsule Take 0.4 mg by mouth daily. 01/08/20   [provider]      VITAL SIGNS:  Blood pressure (!) 142/75, pulse (!) 58, temperature 97.8 F (36.6 C), temperature source Oral, resp. rate 15, height 5\' 7"  (1.702 m), weight 90.7 kg, SpO2 97 %.  PHYSICAL EXAMINATION:  Physical Exam  GENERAL:  69 y.o.-year-old Hispanic male patient lying in the bed with no acute distress.  EYES: Pupils equal, round, reactive to light and accommodation. No scleral icterus. Extraocular muscles intact.  HEENT: Head atraumatic, normocephalic. Oropharynx and nasopharynx clear.  NECK:  Supple, no jugular venous distention. No thyroid enlargement, no tenderness.  LUNGS: Normal breath sounds bilaterally, no wheezing, rales,rhonchi or crepitation. No use of accessory muscles of respiration.  CARDIOVASCULAR: Regular rate and rhythm, S1, S2 normal. No murmurs, rubs, or gallops.  ABDOMEN: Soft, nondistended, nontender.  Bowel sounds present. No organomegaly or mass.  EXTREMITIES: No pedal edema, cyanosis, or clubbing.  NEUROLOGIC: Cranial nerves II through XII are intact. Muscle strength 5/5 in all extremities. Sensation intact. Gait not checked.  PSYCHIATRIC: The patient is alert and oriented x 3.  Normal affect and good eye contact. SKIN: No obvious rash, lesion, or ulcer.   LABORATORY PANEL:   CBC Recent Labs  Lab 01/25/20 1735  WBC 6.2  HGB 13.0  HCT 39.3  PLT 135*   ------------------------------------------------------------------------------------------------------------------  Chemistries  Recent Labs  Lab 01/25/20 1735  NA 137  K 3.9  CL 105  CO2 26  GLUCOSE 125*  BUN 18  CREATININE 0.94  CALCIUM 8.4*  MG 2.4   ------------------------------------------------------------------------------------------------------------------  Cardiac Enzymes No results for input(s): TROPONINI in the last 168 hours. ------------------------------------------------------------------------------------------------------------------  RADIOLOGY:  CT Head Wo Contrast  Result Date: 01/25/2020 CLINICAL DATA:  69 year old male status post fall yesterday with pain. EXAM: CT HEAD WITHOUT CONTRAST TECHNIQUE: Contiguous axial images were obtained from the base of the skull through the vertex without intravenous contrast. COMPARISON:  Head CT 02/08/2011. FINDINGS: Brain: Some generalized cerebral volume loss is noted since 2012. No  midline shift, mass effect, or evidence of intracranial mass lesion. No ventriculomegaly. Chronic dystrophic calcifications in the basal ganglia. No acute intracranial hemorrhage identified. No cortically based acute infarct identified. Gray-white matter differentiation is within normal limits throughout the brain. Vascular: Extensive Calcified atherosclerosis at the skull base. No suspicious intracranial vascular hyperdensity. Skull: Stable and intact. Sinuses/Orbits: Bubbly opacity in  the right sphenoid sinus but other Visualized paranasal sinuses and mastoids are stable and well pneumatized. Other: No acute orbit or scalp soft tissue finding. IMPRESSION: 1. Negative age non contrast CT appearance of the brain. No acute traumatic injury identified. 2. Mild right sphenoid sinus inflammation. Electronically Signed   By: Odessa Fleming M.D.   On: 01/25/2020 18:49   DG Chest Port 1 View  Result Date: 01/25/2020 CLINICAL DATA:  69 year old male status post fall yesterday with hip pain. EXAM: PORTABLE CHEST 1 VIEW COMPARISON:  Chest radiographs.  05/11/2012 and earlier FINDINGS: Portable AP supine view at 1625 hours. Lower lung volumes. Allowing for portable technique the lungs are clear. Visualized tracheal air column is within normal limits. Mediastinal contours are stable allowing for portable technique, heart size at the upper limits of normal and mildly tortuous thoracic aorta. No acute osseous abnormality identified. Negative visible bowel gas pattern. IMPRESSION: No acute cardiopulmonary abnormality or acute traumatic injury identified. Electronically Signed   By: Odessa Fleming M.D.   On: 01/25/2020 18:45   DG Hip Unilat With Pelvis 2-3 Views Right  Result Date: 01/25/2020 CLINICAL DATA:  69 year old male status post fall yesterday with hip pain. EXAM: DG HIP (WITH OR WITHOUT PELVIS) 2-3V RIGHT COMPARISON:  None. FINDINGS: Bone mineralization is within normal limits for age. Femoral heads are normally located. Hip joint spaces appear symmetric. Intact pelvis. SI joints appear within normal limits. Grossly intact proximal left femur. AP and frogleg lateral views of the right hip. The visible right femur is intact. Lumbar spine disc and endplate degeneration. Negative visible abdominal and pelvic visceral contours. IMPRESSION: No acute fracture or dislocation identified about the right hip or pelvis. Electronically Signed   By: Odessa Fleming M.D.   On: 01/25/2020 18:46      IMPRESSION AND PLAN:   1.   Generalized weakness and expressive dysphasia with inability to ambulate/gait abnormality and presyncope/syncope with fall.  Will need to rule out TIA.  The patient will be admitted to an observation medically monitored bed.  We will follow neuro checks q.4 hours for 24 hours.  The patient will be placed on his aspirin and will add Plavix.  Will obtain a brain MRI without contrast as well as bilateral carotid Doppler and 2D echo with bubble study .  A neurology consultation (when available) as well as physical/occupation/speech therapy consults will be obtained in a.m..  I notified Dr. Loretha Brasil  about the patient.  The patient will be placed on statin therapy and fasting lipids will be checked.  We will check his orthostatics.  We will stop his Imitrex for now.  We will hold Toprol-XL for heart rate less than 70 given his mild bradycardia and hold off Wellbutrin XL that may increase its level and potentially contributing to orthostatic hypotension.  2.  Coronary artery disease.  We will continue Toprol-X with permit parameters given bradycardia, aspirin and as needed sublingual nitroglycerin.  3.  Dyslipidemia.  We will continue statin therapy and check fasting lipids.  4.  Depression.  We will continue Zoloft.  5.  BPH.  Continue Flomax.  6.  DVT prophylaxis.  Subtenons Lovenox.    All the records are reviewed and case discussed with ED provider. The plan of care was discussed in details with the patient.  I answered all questions. The patient agreed to proceed with the above mentioned plan. Further management will depend upon hospital course.   CODE STATUS: Full code  TOTAL TIME TAKING CARE OF THIS PATIENT: 55 minutes.    Hannah Beat M.D on 01/25/2020 at 11:30 PM  [facility]   From 7 PM-7 AM, contact night-coverage [email or web address]  CC: Primary care physician; Patient, No Pcp Per   Note: This dictation was prepared with Dragon dictation along with smaller phrase technology.  Any transcriptional errors that result from this process are unintentional.

## 2020-01-25 NOTE — ED Notes (Signed)
Pt transported to xray 

## 2020-01-25 NOTE — ED Notes (Signed)
Pt is resting in bed and pt given warm blankets.

## 2020-01-25 NOTE — ED Triage Notes (Signed)
Pt arrives via ems from home after havign a fall yesterday and hitting his head- family states today he is having some weakness and not able to grip things correctly- stroke screen negative per EMS- pt was orthostatic positive- with an initial pressure of 111/62 and a pressure of 85/62 after standing- pt complaining of r leg pain

## 2020-01-26 ENCOUNTER — Observation Stay: Payer: Medicare HMO

## 2020-01-26 ENCOUNTER — Encounter: Payer: Self-pay | Admitting: Family Medicine

## 2020-01-26 ENCOUNTER — Observation Stay: Admission: EM | Admit: 2020-01-26 | Payer: Medicare HMO | Source: Home / Self Care | Admitting: Family Medicine

## 2020-01-26 ENCOUNTER — Observation Stay (HOSPITAL_BASED_OUTPATIENT_CLINIC_OR_DEPARTMENT_OTHER)
Admission: EM | Admit: 2020-01-26 | Discharge: 2020-01-26 | Disposition: A | Discharge status: Home / Self Care | Payer: Medicare HMO | Source: Home / Self Care | Attending: Family Medicine | Admitting: Family Medicine

## 2020-01-26 DIAGNOSIS — I340 Nonrheumatic mitral (valve) insufficiency: Secondary | ICD-10-CM

## 2020-01-26 DIAGNOSIS — I2510 Atherosclerotic heart disease of native coronary artery without angina pectoris: Secondary | ICD-10-CM | POA: Diagnosis not present

## 2020-01-26 DIAGNOSIS — I442 Atrioventricular block, complete: Secondary | ICD-10-CM | POA: Diagnosis not present

## 2020-01-26 DIAGNOSIS — I495 Sick sinus syndrome: Secondary | ICD-10-CM

## 2020-01-26 DIAGNOSIS — R531 Weakness: Secondary | ICD-10-CM | POA: Diagnosis not present

## 2020-01-26 LAB — LIPID PANEL
Cholesterol: 110 mg/dL (ref 0–200)
HDL: 41 mg/dL (ref >40)
LDL Cholesterol: 48 mg/dL (ref 0–99)
Total CHOL/HDL Ratio: 2.7 RATIO
Triglycerides: 107 mg/dL (ref <150)
VLDL: 21 mg/dL (ref 0–40)

## 2020-01-26 LAB — HIV ANTIBODY (ROUTINE TESTING W REFLEX): HIV Screen 4th Generation wRfx: NONREACTIVE

## 2020-01-26 LAB — HEMOGLOBIN A1C
Hgb A1c MFr Bld: 5.4 % (ref 4.8–5.6)
Mean Plasma Glucose: 108.28 mg/dL

## 2020-01-26 MED ORDER — ACETAMINOPHEN 325 MG PO TABS: 650.0000 mg | ORAL_TABLET | Freq: Four times a day (QID) | ORAL | 0 refills | Status: AC | PRN

## 2020-01-26 MED ORDER — PNEUMOCOCCAL VAC POLYVALENT 25 MCG/0.5ML IJ INJ: 0.5000 mL | INJECTION | INTRAMUSCULAR | Status: DC

## 2020-01-26 MED ORDER — METOPROLOL SUCCINATE ER 25 MG PO TB24
12.5000 mg | ORAL_TABLET | Freq: Every day | ORAL | Status: DC
  Filled 2020-01-26: qty 1

## 2020-01-26 NOTE — Discharge Instructions (Signed)
Debilidad Weakness La debilidad es la falta de energa. Puede sentir debilidad en todo el cuerpo (generalizada) o puede sentirse dbil en una parte del cuerpo (focalizada). La debilidad puede tener muchas causas posibles. A veces, es posible que no se conozca la causa de la debilidad. Algunas causas de debilidad pueden ser graves, por lo tanto, es importante consultar al mdico. Siga estas indicaciones en su casa: Actividad  Descanse todo lo que sea necesario.  Intente dormir lo suficiente. La [employer] de los adultos necesitan entre 7y8horas de sueo todas las noches. Hable con el mdico acerca de cunto debe dormir cada noche.  Haga ejercicios, como flexiones de brazos y Armed forces logistics/support/administrative officer de piernas, durante 30 minutos al menos 2 das a la semana o como se lo haya indicado el mdico.  Piense en trabajar con un fisioterapeuta o un entrenador para que lo ayude a fortalecerse. Indicaciones generales   [employer] de venta libre y los recetados solamente como se lo haya indicado el mdico.  Consuma una dieta sana y [place]. Esto puede comprender lo siguiente: ? Protenas para desarrollar msculos, como carnes magras y pescado. ? Lambert Mody y verduras frescas. ? Hidratos de carbono para Educational psychologist, como cereales integrales.  Beba suficiente lquido para Contractor pis (la orina) de color amarillo plido.  Concurra a todas las visitas de seguimiento como se lo haya indicado el mdico. Esto es importante. Comunquese con un mdico si:  La debilidad no mejora o empeora.  La debilidad afecta su capacidad de hacer lo siguiente: ? Pensar con claridad. ? Realizar sus actividades diarias habituales. Solicite ayuda inmediatamente si:  Tiene debilidad repentina en un lado de la cara o del cuerpo.  Siente dolor en el pecho.  Tiene dificultad para respirar o Risk manager.  Presenta problemas de visin.  Tiene dificultad para hablar o tragar.  Tiene dificultad  para mantenerse de pie o caminar.  Se siente mareado.  Se desmaya (pierde el conocimiento). Resumen  La debilidad es la falta de [place]. Puede sentir debilidad en todo el cuerpo o slo en [employer].  La debilidad puede tener muchas causas posibles. A veces, es posible que no se conozca la causa de la debilidad.  Haga reposo segn sea necesario y trate de dormir lo suficiente. La [employer] de los adultos necesitan entre 7y8horas de sueo todas las noches.  Consuma una dieta sana y Bryson Dames equilibrada. Esta informacin no tiene Marine scientist el consejo del mdico. Asegrese de hacerle al mdico cualquier pregunta que tenga. Document Revised: 08/17/2018 Document Reviewed: 08/17/2018 Elsevier Patient Education  2020 [place] Fatigue Si siente fatiga, tiene una sensacin de cansancio en todo momento y falta de energa o falta de motivacin. La fatiga puede dificultar el inicio o la realizacin de tareas debido al agotamiento. Por lo general, la fatiga ocasional o leve con frecuencia es una reaccin normal a la actividad o la vida. Sin embargo, la fatiga de Engineer, site duracin (crnica) o extrema puede ser sntoma de una enfermedad. Siga estas indicaciones en su casa: Instrucciones generales  Controle su fatiga para ver si hay cambios.  Acustese y levntese a la misma hora todos [place].  Evite la fatiga moderando su ritmo [place] y durmiendo lo suficiente durante la noche.  Mantenga un peso saludable. Medicamentos  [employer] de venta libre y los recetados solamente como se lo haya indicado el mdico.  Tome una multivitamina, si se lo indic el mdico.  No tome ningn complemento alimenticio o a base de hierbas a menos que lo haya autorizado el mdico. Actividad   Realice ejercicio con regularidad como se lo haya indicado el mdico.  Practique tcnicas que lo ayuden a relajarse, como yoga, tai chi, meditacin, terapia de masajes. Comida y  bebida   Evite las comidas abundantes a la noche.  Consuma una dieta bien balanceada que incluya protenas magras, cereales integrales, abundantes frutas, verduras y productos lcteos descremados.  Evite el consumo excesivo de cafena.  Evite el consumo de alcohol.  Beba suficiente lquido para Consulting civil engineer orina de color amarillo plido. [place] situaciones que le provocan estrs. Trate de que su horario de [place] y personal estn equilibrados.  No consuma ningn producto que contenga nicotina o tabaco, como cigarrillos y Psychologist, sport and exercise. Si necesita ayuda para dejar de fumar, consulte al mdico.  No consuma drogas. Comunquese con un mdico si:  La fatiga no mejora.  Tiene fiebre.  Pierde peso o [place] de peso de forma repentina.  Tiene dolores de [place].  Tiene problemas para conciliar el sueo o para dormir en la noche.  Se siente enojado, culpable, ansioso o triste.  No puede defecar (estreimiento).  Tiene la piel seca.  Observa hinchazn en las piernas u otras partes del cuerpo. Solicite ayuda de inmediato si:  Se siente confundido.  Tiene visin borrosa.  Tiene mareos o se desmaya.  Tiene un dolor de cabeza intenso.  Siente dolor intenso en el abdomen, la espalda o el rea entre la cintura y las caderas (pelvis).  Tiene dolor de pecho, dificultad para respirar, o latidos cardacos irregulares o acelerados.  No puede orinar u [employer] de lo normal.  Presenta sangrado anormal, como sangrado del recto, la vagina, la nariz, los pulmones o los pezones.  Vomita sangre.  Tiene pensamientos acerca de lastimarse a usted mismo o a Producer, television/film/video. Si alguna vez siente que puede lastimarse o Market researcher a los dems, o tiene pensamientos de poner fin a su vida, busque ayuda de inmediato. Puede dirigirse al servicio de emergencias ms cercano o comunicarse con:  El servicio de Therapist, sports (911 en los [place]).  Una  lnea de asistencia al suicida y Freight forwarder en crisis, como la [employer] de Prevencin del Suicidio (National Suicide Prevention Lifeline) al [phone]. Est disponible las 24 horas del da. Resumen  Si siente fatiga, tiene una sensacin de cansancio en todo momento y falta de energa o falta de motivacin.  La fatiga puede dificultar el inicio o la realizacin de tareas debido al agotamiento.  La fatiga de larga duracin (crnica) o extrema puede ser sntoma de una enfermedad.  Realice ejercicio con regularidad como se lo haya indicado el mdico.  Cambie las situaciones que le provocan estrs. Trate de que su horario de [place] y personal estn equilibrados. Esta informacin no tiene Marine scientist el consejo del mdico. Asegrese de hacerle al mdico cualquier pregunta que tenga. Document Revised: 11/30/2017 Document Reviewed: 11/30/2017 Elsevier Patient Education  [place].  Bradicardia, en adultos Bradycardia, Adult La bradicardia es una frecuencia cardaca ms lenta que lo normal. El corazn late [employer] 50 y 100 veces por minuto en un adulto, en reposo. Si presenta bradicardia, la frecuencia cardaca en reposo es inferior a 60latidos por minuto. La bradicardia puede impedir que llegue la cantidad suficiente de oxgeno a ciertas zonas del cuerpo cuando est realizando [place]. Puede ser grave si impide que llegue la suficiente  cantidad de oxgeno al cerebro y a otras partes del cuerpo. La bradicardia no representa un problema para todas las personas. Para algunos adultos sanos, una frecuencia cardaca lenta en reposo es normal. Cules son las causas? Esta afeccin puede ser causada por lo siguiente:  Problemas con el corazn, estos incluyen: ? Un problema con el sistema elctrico del corazn, por ejemplo, un bloqueo cardaco. Con un bloqueo cardaco, las seales elctricas entre las cavidades del corazn estn parcial o completamente cerradas,  de modo que no pueden funcionar como deberan. ? Un problema con el marcapasos natural del corazn (ndulo sinusal). ? Enfermedad cardaca. ? Infarto de miocardio. ? Dao cardaco. ? Enfermedad de [place]. ? Infeccin en el corazn. ? Afeccin cardaca que est presente al nacer (defecto cardaco congnito).  Determinados medicamentos para el tratamiento de afecciones cardacas.  Determinadas afecciones, como hipotiroidismo y apnea obstructiva del sueo.  Problemas con el equilibrio de sustancias qumicas y otras sustancias en la sangre, como el [place].  Traumatismos.  Radioterapia. Qu incrementa el riesgo? Es ms probable que tenga esta afeccin si:  Es mayor de 51aos de edad.  Tiene presin arterial alta (hipertensin), colesterol alto (hiperlipidemia) o diabetes.  Toma mucho alcohol, consume productos con tabaco o nicotina o [place] drogas. Cules son los signos o los sntomas? Los sntomas de esta afeccin incluyen:  Desvanecimiento.  Desmayo o sensacin de desvanecimiento.  Fatiga y debilidad.  Dificultad para realizar Scientist, forensic ejercicio.  Falta de aire.  Dolor en el pecho (angina).  Somnolencia.  Confusin.  Mareos. Cmo se diagnostica? Esta afeccin se puede diagnosticar en funcin de lo siguiente:  Sus sntomas.  Sus antecedentes mdicos.  Un examen fsico. Durante el examen, el mdico auscultar la frecuencia cardaca y controlar el pulso. El mdico tambin puede indicarle estudios para confirmar el diagnstico, como los siguientes:  Anlisis de [place].  Electrocardiograma (ECG). El electrocardiograma registra la actividad elctrica del corazn. El estudio puede mostrar el ritmo del latido cardaco y si la frecuencia cardaca se mantiene.  Un estudio en el que se utiliza un dispositivo porttil (grabadora de eventos) o Holter para registrar la actividad elctrica del corazn mientras realiza sus actividades cotidianas.  Una prueba de  ejercicio. Cmo se trata? El tratamiento de esta afeccin depende de la intensidad de los sntomas y de la causa de Engineer, manufacturing systems. El tratamiento puede implicar lo siguiente:  Tratamiento de la afeccin preexistente.  Cambiar los medicamentos o la dosis de [place].  Implantar debajo de la piel un dispositivo a batera pequeo llamado marcapasos. Cuando se produce un episodio de bradicardia, este dispositivo puede utilizarse para aumentar la frecuencia cardaca y ayudar a que su corazn lata a un ritmo constante. Siga estas indicaciones en su casa: Estilo de [place] indicaciones del mdico para controlar cualquier enfermedad que contribuya a la bradicardia.  Siga una dieta cardiosaludable. Un especialista en nutricin (nutricionista) puede darle instrucciones sobre opciones y variantes de alimentos saludables.  Siga un programa de actividad fsica aprobado por el mdico.  Mantenga un peso saludable.  Trate de reducir o Advice worker de estrs con actividades como el yoga o la meditacin. Si necesita ayuda para reducir [employer] de estrs, consulte al mdico.  No consuma ningn producto que contenga nicotina o tabaco, como cigarrillos, cigarrillos electrnicos y tabaco de Higher education careers adviser. Si necesita ayuda para dejar de consumir, consulte al mdico.  No consuma drogas ilegales.  Limite el consumo de alcohol a no ms de 39mdida por da  si es mujer y no est [place], y 20mdidas por da si es hombre. Est atento a la cantidad de alcohol que hay en las bebidas que toma. En los [place] una medida equivale a una botella de cerveza de 12oz (3574m, un vaso de vino de 5oz (14885mo un vaso de una bebida alcohlica de alta graduacin de 1oz (58m16mIndicaciones generales  TomeDelphiventa libre y los recetados solamente como se lo haya indicado el mdico.  ConcConsulting civil engineerodas las visitas de control como se lo haya indicado el mdico. Esto es importante. Cmo se  evita? En algunos casos, la bradicardia se puede evitar a travs de:  El tratamiento de problemas mdicos preexistentes.  Evitar las conductas o medicamentos que pueden deseGeographical information systems officermunquese con un mdico si:  Se siente mareado o siente que va a desvanecerse.  Casi se desmaya.  Se siente dbil o se fatiga con facilidad durante la actividad fsica.  Siente confusin o tiene problemas de memoDeminglicite ayuda inmediatamente si:  Se desmaya.  Tiene los siguientes sntomas: ? Latidos cardacos irregulares (palpitaciones). ? DoloTourist information centre managerDificultad para respirar. Resumen  La bradicardia es una frecuencia cardaca ms lenta que lo normal. Si presenta bradicardia, la frecuencia cardaca en reposo es inferior a 60latidos por minuto.  El tratamiento de esta afeccin depende de la causa.  Siga las indicaciones del mdico para controlar cualquier enfermedad que contribuya a la bradicardia.  No consuma ningn producto que contenga nicotina o tabaco, como cigarrillos, cigarrillos electrnicos y tabaco de mascHigher education careers adviserlimite el consumo de alcohol.  Concurra a todas las visitas de control como se lo haya indicado el mdico. Esto es importante. Esta informacin no tiene comoMarine scientistconsejo del mdico. Asegrese de hacerle al mdico cualquier pregunta que tenga. Document Revised: 07/24/2018 Document Reviewed: 07/24/2018 Elsevier Patient Education  2020[place]

## 2020-01-26 NOTE — Progress Notes (Signed)
Physical Therapy Evaluation Patient Details Name: Austin Wagner MRN: [id number] DOB: Dec 18, 1951 Today's Date: 01/26/2020   History of Present Illness  Austin Wagner  is a 69 y.o. Hispanic male with a known history of hypertension, dyslipidemia, depression, coronary artery disease status post PCI and 3 stents, GERD and migraines, who presented to the emergency room with an onset of generalized weakness and fainting feeling.  He stated that he was not able to ambulate.  He had some dysphasia but denied dysphagia.  He has been having occasional headache and admits to dizziness without blurred vision or diplopia.  No chest pain or palpitations.  No nausea vomiting or abdominal pain.  No fever or chills.  He denied any urinary or stool incontinence.  He was not able to walk without assistance.  He denied any unilateral weakness however.  He had a fall yesterday without injuries.  Clinical Impression  Patient is able to perform bed mobility independently supine to sit and sit to supine. He is able to transfer sit to stand with RW and MI. He is able to ambulate with RW and MI 356feet, reports dizziness. He is able to perform seated static and dynamic balance independently, stand tandem and single leg without support . He will benefit from skilled PT to continue to educate on safety using RW.     Follow Up Recommendations Outpatient PT    Equipment Recommendations       Recommendations for Other Services       Precautions / Restrictions Precautions Precautions: Fall Precaution Comments: seizures Restrictions Weight Bearing Restrictions: No      Mobility  Bed Mobility Overal bed mobility: Independent Bed Mobility: Supine to Sit;Sit to Supine     Supine to sit: Independent Sit to supine: Independent      Transfers Overall transfer level: Independent Equipment used: Rolling walker (2 wheeled) Transfers: Sit to/from Stand Sit to Stand: Modified independent  (Device/Increase time)            Ambulation/Gait Ambulation/Gait assistance: Modified independent (Device/Increase time) Gait Distance (Feet): 300 Feet Assistive device: Rolling walker (2 wheeled) Gait Pattern/deviations: Step-through pattern     General Gait Details: (decreased gait speed)  Stairs            Wheelchair Mobility    Modified Rankin (Stroke Patients Only)       Balance Overall balance assessment: Modified Independent Sitting-balance support: Feet supported Sitting balance-Leahy Scale: Normal     Standing balance support: No upper extremity supported Standing balance-Leahy Scale: Good Standing balance comment: (single leg stand I, tandem stand I )                             Pertinent Vitals/Pain Pain Assessment: No/denies pain    Home Living Family/patient expects to be discharged to:: Private residence Living Arrangements: Spouse/significant other Available Help at Discharge: Family Type of Home: House Home Access: Stairs to enter Entrance Stairs-Rails: Right;Left;Can reach both Technical brewer of Steps: 2 Home Layout: One level Home Equipment: Shower seat      Prior Function Level of Independence: Needs assistance   Gait / Transfers Assistance Needed: pt indep with mobility  ADL's / Homemaking Assistance Needed: indep with ADL, son's wife provides transportation for pt  Comments: (Pt reports that he goes to out patient PT 1 x week)     Hand Dominance        Extremity/Trunk Assessment   Upper Extremity Assessment Upper  Extremity Assessment: Generalized weakness    Lower Extremity Assessment Lower Extremity Assessment: Generalized weakness    Cervical / Trunk Assessment Cervical / Trunk Assessment: Normal  Communication   Communication: Prefers language other than [place];Interpreter utilized(Spanish interpreter, in person, Bonnielee Haff)  Cognition Arousal/Alertness: Awake/alert Behavior During  Therapy: WFL for tasks assessed/performed Overall Cognitive Status: Within Functional Limits for tasks assessed                                        General Comments      Exercises Other Exercises Other Exercises: orthostatic vitals taken during sitting/standing, reported to RN after session Other Exercises: functional transfer training with RW with instruction in hand placement and weight shift on feet to improve stability   Assessment/Plan    PT Assessment Patient needs continued PT services  PT Problem List Other (comment)       PT Treatment Interventions Gait training    PT Goals (Current goals can be found in the Care Plan section)  Acute Rehab PT Goals Patient Stated Goal: not be dizzy PT Goal Formulation: Patient unable to participate in goal setting Time For Goal Achievement: 02/09/20 Potential to Achieve Goals: Good    Frequency Min 2X/week   Barriers to discharge (dizziness and giddiness)      Co-evaluation               AM-PAC PT "6 Clicks" Mobility  Outcome Measure Help needed turning from your back to your side while in a flat bed without using bedrails?: None Help needed moving from lying on your back to sitting on the side of a flat bed without using bedrails?: None Help needed moving to and from a bed to a chair (including a wheelchair)?: None Help needed standing up from a chair using your arms (e.g., wheelchair or bedside chair)?: None Help needed to walk in hospital room?: None Help needed climbing 3-5 steps with a railing? : A Little 6 Click Score: 23    End of Session Equipment Utilized During Treatment: Gait belt Activity Tolerance: Patient tolerated treatment well Patient left: in bed;with bed alarm set Nurse Communication: Mobility status PT Visit Diagnosis: Repeated falls (R29.6);History of falling (Z91.81);Difficulty in walking, not elsewhere classified (R26.2)    Time: 1100-1135 PT Time Calculation (min) (ACUTE  ONLY): 35 min   Charges:   PT Evaluation $PT Eval Low Complexity: 1 Low PT Treatments $Gait Training: 8-22 mins $Therapeutic Activity: 8-22 mins         Ezekiel Ina, PT DPT 01/26/2020, 11:33 AM

## 2020-01-26 NOTE — Progress Notes (Signed)
Chart reviewed. Pt is being discharged today. No reported dysphagia. ST eval is ordered as a cognitive eval. If Pt shows and speech or language deficits after discharge, rec. OP ST eval and treat.

## 2020-01-26 NOTE — Evaluation (Signed)
Occupational Therapy Evaluation Patient Details Name: Austin Wagner MRN: [id number] DOB: January 15, 1951 Today's Date: 01/26/2020    History of Present Illness 69 y.o. Hispanic male with a known history of hypertension, dyslipidemia, depression, coronary artery disease status post PCI and 3 stents, GERD and migraines, who presented to the emergency room with an onset of generalized weakness and fainting feeling.  He stated that he was not able to ambulate.  He had some dysphasia but denied dysphagia.  He has been having occasional headache and admits to dizziness without blurred vision or diplopia.  No chest pain or palpitations.  No nausea vomiting or abdominal pain.  No fever or chills.  He denied any urinary or stool incontinence.  He was not able to walk without assistance.  He denied any unilateral weakness however.  He had a fall yesterday without injuries. Imaging negative for acute infarct of fracture.   Clinical Impression   Pt was seen for OT evaluation this date with Spanish speaking Interpreter present. Prior to hospital admission, pt was ambulating without AD and indep with basic ADL tasks, only requiring assist from family for transportation and IADL Tasks such as meal prep and cleaning. Pt lives in a 1 story home with his spouse. Son's wife provides transportation. Pt endorses 4-5 falls in past 12 months where "I stretch out until I fall" and they occur without warning. Currently pt demonstrates impairments as described below (See OT problem list) which functionally limit his ability to perform ADL/self-care tasks. Pt currently requires Min A for LB ADL tasks and Min A for functional ADL transfers, with instruction in RW use and hand placement. Orthostatic vitals taken (pt seated EOB at start of session so no supine vitals). Siting EOB BP 134/69, standing 0 min BP 119/72. Pt denied dizziness. Pt instructed in hand placement during functional transfer training to improve stability. Pt  would benefit from skilled OT to address noted impairments and functional limitations (see below for any additional details) in order to maximize safety and independence while minimizing falls risk and caregiver burden. Upon hospital discharge, recommend STR vs HHOT to maximize pt safety and return to PLOF, pending pt's progress.     Follow Up Recommendations  Supervision/Assistance - 24 hour;SNF(supervision for all mobility and standing ADL tasks)    Equipment Recommendations  3 in 1 bedside commode    Recommendations for Other Services       Precautions / Restrictions Precautions Precautions: Fall;Other (comment) Precaution Comments: seizures Restrictions Weight Bearing Restrictions: No      Mobility Bed Mobility Overal bed mobility: Needs Assistance Bed Mobility: Sit to Supine       Sit to supine: Min guard      Transfers Overall transfer level: Needs assistance Equipment used: Rolling walker (2 wheeled) Transfers: Sit to/from Stand Sit to Stand: Min assist;From elevated surface              Balance Overall balance assessment: Needs assistance Sitting-balance support: No upper extremity supported;Feet supported Sitting balance-Leahy Scale: Fair     Standing balance support: Bilateral upper extremity supported;Single extremity supported Standing balance-Leahy Scale: Poor Standing balance comment: required UE-BUE support on RW for stability                           ADL either performed or assessed with clinical judgement   ADL Overall ADL's : Needs assistance/impaired Eating/Feeding: Sitting;Independent   Grooming: Sitting;Independent   Upper Body Bathing: Sitting;Set up;Supervision/ safety  Lower Body Bathing: Sit to/from stand;Minimal assistance   Upper Body Dressing : Sitting;Supervision/safety;Set up   Lower Body Dressing: Sit to/from stand;Minimal assistance   Toilet Transfer: RW;Ambulation;BSC;Minimal assistance;Min guard                    Vision Baseline Vision/History: Wears glasses(pt reports L eye became blurry once before and resolved) Wears Glasses: At all times Patient Visual Report: No change from baseline Vision Assessment?: No apparent visual deficits     Perception     Praxis      Pertinent Vitals/Pain Pain Assessment: No/denies pain     Hand Dominance     Extremity/Trunk Assessment Upper Extremity Assessment Upper Extremity Assessment: Generalized weakness   Lower Extremity Assessment Lower Extremity Assessment: Generalized weakness   Cervical / Trunk Assessment Cervical / Trunk Assessment: Normal   Communication Communication Communication: Prefers language other than [place];Interpreter utilized(Spanish interpreter, in person, Bonnielee Haff)   Cognition Arousal/Alertness: Awake/alert Behavior During Therapy: WFL for tasks assessed/performed Overall Cognitive Status: Within Functional Limits for tasks assessed                                     General Comments       Exercises Other Exercises Other Exercises: orthostatic vitals taken during sitting/standing, reported to RN after session Other Exercises: functional transfer training with RW with instruction in hand placement and weight shift on feet to improve stability   Shoulder Instructions      Home Living Family/patient expects to be discharged to:: Private residence Living Arrangements: Spouse/significant other Available Help at Discharge: Family Type of Home: House Home Access: Stairs to enter Secretary/administrator of Steps: 2 Entrance Stairs-Rails: Right;Left;Can reach both Home Layout: One level     Bathroom Shower/Tub: Chief Strategy Officer: Standard     Home Equipment: Information systems manager          Prior Functioning/Environment Level of Independence: Needs assistance  Gait / Transfers Assistance Needed: pt indep with mobility ADL's / Homemaking Assistance Needed: indep  with ADL, son's wife provides transportation for pt   Comments: pt reports 4-5 falls, "I stretch out until I fall"        OT Problem List: Decreased strength;Impaired balance (sitting and/or standing);Decreased knowledge of use of DME or AE;Decreased activity tolerance      OT Treatment/Interventions: Self-care/ADL training;Therapeutic exercise;Therapeutic activities;Energy conservation;DME and/or AE instruction;Patient/family education;Balance training    OT Goals(Current goals can be found in the care plan section) Acute Rehab OT Goals Patient Stated Goal: get stronger OT Goal Formulation: With patient Time For Goal Achievement: 02/09/20 Potential to Achieve Goals: Good ADL Goals Pt Will Perform Lower Body Dressing: with min guard assist;sit to/from stand;with adaptive equipment Pt Will Transfer to Toilet: with min guard assist;ambulating;regular height toilet(LRAD for amb) Additional ADL Goal #1: Pt will verbalize plan to implement at least 1 learned falls prevention strategy to maximize safety and minimize falls.  OT Frequency: Min 1X/week   Barriers to D/C:            Co-evaluation              AM-PAC OT "6 Clicks" Daily Activity     Outcome Measure Help from another person eating meals?: None Help from another person taking care of personal grooming?: A Little Help from another person toileting, which includes using toliet, bedpan, or urinal?: A Little Help from another  person bathing (including washing, rinsing, drying)?: A Little Help from another person to put on and taking off regular upper body clothing?: A Little Help from another person to put on and taking off regular lower body clothing?: A Little 6 Click Score: 19   End of Session Equipment Utilized During Treatment: Gait belt;Rolling walker Nurse Communication: Other (comment)(orthostatic vitals)  Activity Tolerance: Patient tolerated treatment well Patient left: in bed;with call bell/phone within  reach;Other (comment)(supine in bed with staff and interpreter for echocardiogram)  OT Visit Diagnosis: Other abnormalities of gait and mobility (R26.89);Repeated falls (R29.6);Muscle weakness (generalized) (M62.81)                Time: 2956-2130 OT Time Calculation (min): 18 min Charges:  OT General Charges $OT Visit: 1 Visit OT Evaluation $OT Eval Moderate Complexity: 1 Mod  Richrd Prime, MPH, MS, OTR/L ascom [phone] 01/26/20, 10:41 AM

## 2020-01-26 NOTE — Progress Notes (Signed)
Cardiology Consultation:   Patient ID: Austin Wagner MRN: [id number]; DOB: 07-Aug-1951  Admit date: 01/25/2020 Date of Consult: 01/26/2020  Primary Care Provider: Patient, No Pcp Per Primary Cardiologist: New- [facility] rounding Primary Electrophysiologist:  None    Patient Profile:   Austin Wagner is a 69 y.o. male with a hx of hypertension, hyperlipidemia, CAD who is being seen today for the evaluation of bradycardia, first-degree AV block at the request of Dr. Harold Hedge.  History of Present Illness:   Austin Wagner 69 year old male with history of hypertension, hyperlipidemia, CAD who presents to the hospital due to generalized weakness and fainting feeling and dizziness.  Denies chest pain, palpitations, nausea, vomiting, fever, chills.  Upon admission, heart rate was noted to be 55.  Other vital signs are normal.  Cardiology consulted for low heart rates.  Patient takes Toprol-XL 25 mg daily.  Echocardiogram was being performed at bedside during my exam today.  Heart Pathway Score:     Past Medical History:  Diagnosis Date   Hypertension     Past Surgical History:  Procedure Laterality Date   CHOLECYSTECTOMY       Home Medications:  Prior to Admission medications   Medication Sig Start Date End Date Taking? Authorizing Provider  albuterol (VENTOLIN HFA) 108 (90 Base) MCG/ACT inhaler Inhale 2 puffs into the lungs every 6 (six) hours as needed. 12/23/19  Yes [provider]  aspirin 81 MG EC tablet Take 1 tablet by mouth daily. 09/05/15  Yes [provider]  buPROPion (WELLBUTRIN XL) 300 MG 24 hr tablet Take 300 mg by mouth daily. 11/26/19  Yes [provider]  diclofenac Sodium (VOLTAREN) 1 % GEL Apply 2 g topically 4 (four) times daily. 12/15/19  Yes [provider]  fluticasone (FLONASE) 50 MCG/ACT nasal spray Place 1 spray into both nostrils daily. 11/17/19  Yes [provider]  hydrOXYzine (ATARAX/VISTARIL)  25 MG tablet Take 25 mg by mouth 3 (three) times daily as needed. 01/13/20  Yes [provider]  isosorbide mononitrate (IMDUR) 30 MG 24 hr tablet Take 60 mg by mouth daily. 12/28/19  Yes [provider]  losartan (COZAAR) 100 MG tablet Take 100 mg by mouth daily. 09/06/19  Yes [provider]  metoprolol succinate (TOPROL-XL) 25 MG 24 hr tablet Take 1 tablet by mouth daily. 01/24/19  Yes [provider]  naproxen (NAPROSYN) 500 MG tablet Take 500 mg by mouth 2 (two) times daily as needed. 12/23/19  Yes [provider]  omeprazole (PRILOSEC) 40 MG capsule Take 40 mg by mouth daily. 12/04/19  Yes [provider]  pregabalin (LYRICA) 150 MG capsule Take 150 mg by mouth 2 (two) times daily. 11/26/19  Yes [provider]  rosuvastatin (CRESTOR) 40 MG tablet Take 40 mg by mouth daily. 11/26/19  Yes [provider]  sertraline (ZOLOFT) 100 MG tablet Take 150 mg by mouth daily. 12/29/19  Yes [provider]  SUMAtriptan (IMITREX) 50 MG tablet Take 50 mg by mouth daily as needed. 11/05/19  Yes [provider]  omeprazole (PRILOSEC) 20 MG capsule Take 20 mg by mouth daily. 10/05/19   [provider]  tamsulosin (FLOMAX) 0.4 MG CAPS capsule Take 0.4 mg by mouth daily. 01/08/20   [provider]    Inpatient Medications: Scheduled Meds:  aspirin EC  81 mg Oral Daily   clopidogrel  75 mg Oral Daily   enoxaparin (LOVENOX) injection  40 mg Subcutaneous Q24H   fluticasone  1  spray Each Nare Daily   influenza vaccine adjuvanted  0.5 mL Intramuscular Tomorrow-1000   isosorbide mononitrate  60 mg Oral Daily   losartan  100 mg Oral Daily   metoprolol succinate  12.5 mg Oral Daily   pantoprazole  40 mg Oral Daily   [START ON 01/27/2020] pneumococcal 23 valent vaccine  0.5 mL Intramuscular Tomorrow-1000   pregabalin  150 mg Oral BID   rosuvastatin  40 mg Oral Daily   sertraline  150 mg Oral Daily    Continuous Infusions:  sodium chloride 100 mL/hr at 01/26/20 0500   PRN Meds: acetaminophen **OR** acetaminophen (TYLENOL) oral liquid 160 mg/5 mL **OR** acetaminophen, albuterol, diclofenac Sodium, hydrOXYzine, senna-docusate  Allergies:    Allergies  Allergen Reactions   Flomax [Tamsulosin]     Social History:   Social History   Socioeconomic History   Marital status: Married    Spouse name: Not on file   Number of children: Not on file   Years of education: Not on file   Highest education level: Not on file  Occupational History   Not on file  Tobacco Use   Smoking status: Never Smoker   Smokeless tobacco: Never Used  Substance and Sexual Activity   Alcohol use: Not on file   Drug use: Not on file   Sexual activity: Not on file  Other Topics Concern   Not on file  Social History Narrative   Not on file   Social Determinants of Health   Financial Resource Strain:    Difficulty of Paying Living Expenses: Not on file  Food Insecurity:    Worried About Running Out of Food in the Last Year: Not on file   Ran Out of Food in the Last Year: Not on file  Transportation Needs:    Lack of Transportation (Medical): Not on file   Lack of Transportation (Non-Medical): Not on file  Physical Activity:    Days of Exercise per Week: Not on file   Minutes of Exercise per Session: Not on file  Stress:    Feeling of Stress : Not on file  Social Connections:    Frequency of Communication with Friends and Family: Not on file   Frequency of Social Gatherings with Friends and Family: Not on file   Attends Religious Services: Not on file   Active Member of Clubs or Organizations: Not on file   Attends BankerClub or Organization Meetings: Not on file   Marital Status: Not on file  Intimate Partner Violence:    Fear of Current or Ex-Partner: Not on file   Emotionally Abused: Not on file   Physically Abused: Not on file   Sexually Abused: Not on file     Family History:   No known family history of heart disease.  ROS:  Please see the history of present illness.   All other ROS reviewed and negative.     Physical Exam/Data:   Vitals:   01/26/20 0225 01/26/20 0411 01/26/20 0609 01/26/20 0802  BP:  128/77 125/76 128/79  Pulse: (!) 49 (!) 48 (!) 50 (!) 51  Resp:    20  Temp:    (!) 96.3 F (35.7 C)  TempSrc:    Axillary  SpO2: 99% 90% (!) 89% 95%  Weight:      Height:        Intake/Output Summary (Last 24 hours) at 01/26/2020 1047 Last data filed at 01/26/2020 0500 Gross per 24 hour  Intake 336.88 ml  Output --  Net 336.88 ml   Last 3 Weights 01/25/2020  Weight (lbs) 200 lb  Weight (kg) 90.719 kg     Body mass index is 31.32 kg/m.  General:  Well nourished, well developed, in no acute distress HEENT: normal Lymph: no adenopathy Neck: no JVD Endocrine:  No thryomegaly Vascular: No carotid bruits; FA pulses 2+ bilaterally without bruits  Cardiac:  normal S1, S2; bradycardic, regular; no murmur  Lungs:  clear to auscultation bilaterally, no wheezing, rhonchi or rales  Abd: soft, nontender, no hepatomegaly  Ext: no edema Musculoskeletal:  No deformities, BUE and BLE strength normal and equal Skin: warm and dry  Neuro:  CNs 2-12 intact, no focal abnormalities noted Psych:  Normal affect   EKG:  The EKG was personally reviewed and demonstrates: Sinus bradycardia, heart rate 55, first-degree AV block. Telemetry:  Telemetry was personally reviewed and demonstrates: Sinus bradycardia, heart rate 56.  Relevant CV Studies: Echocardiogram pending.  Reviewed by myself at bedside showing normal ejection fraction.  Full echo report to follow.  Laboratory Data:  High Sensitivity Troponin:   Recent Labs  Lab 01/25/20 1735 01/25/20 2050  TROPONINIHS 3 4     Chemistry Recent Labs  Lab 01/25/20 1735  NA 137  K 3.9  CL 105  CO2 26  GLUCOSE 125*  BUN 18  CREATININE 0.94  CALCIUM 8.4*  GFRNONAA >60  GFRAA >60   ANIONGAP 6    No results for input(s): PROT, ALBUMIN, AST, ALT, ALKPHOS, BILITOT in the last 168 hours. Hematology Recent Labs  Lab 01/25/20 1735  WBC 6.2  RBC 4.41  HGB 13.0  HCT 39.3  MCV 89.1  MCH 29.5  MCHC 33.1  RDW 13.9  PLT 135*   BNPNo results for input(s): BNP, PROBNP in the last 168 hours.  DDimer No results for input(s): DDIMER in the last 168 hours.   Radiology/Studies:  CT Head Wo Contrast  Result Date: 01/25/2020 CLINICAL DATA:  69 year old male status post fall yesterday with pain. EXAM: CT HEAD WITHOUT CONTRAST TECHNIQUE: Contiguous axial images were obtained from the base of the skull through the vertex without intravenous contrast. COMPARISON:  Head CT 02/08/2011. FINDINGS: Brain: Some generalized cerebral volume loss is noted since 2012. No midline shift, mass effect, or evidence of intracranial mass lesion. No ventriculomegaly. Chronic dystrophic calcifications in the basal ganglia. No acute intracranial hemorrhage identified. No cortically based acute infarct identified. Gray-white matter differentiation is within normal limits throughout the brain. Vascular: Extensive Calcified atherosclerosis at the skull base. No suspicious intracranial vascular hyperdensity. Skull: Stable and intact. Sinuses/Orbits: Bubbly opacity in the right sphenoid sinus but other Visualized paranasal sinuses and mastoids are stable and well pneumatized. Other: No acute orbit or scalp soft tissue finding. IMPRESSION: 1. Negative age non contrast CT appearance of the brain. No acute traumatic injury identified. 2. Mild right sphenoid sinus inflammation. Electronically Signed   By: Odessa Fleming M.D.   On: 01/25/2020 18:49   MR BRAIN WO CONTRAST  Result Date: 01/26/2020 CLINICAL DATA:  Initial evaluation for acute onset generalized weakness, inability to walk. EXAM: MRI HEAD WITHOUT CONTRAST TECHNIQUE: Multiplanar, multiecho pulse sequences of the brain and surrounding structures were obtained  without intravenous contrast. COMPARISON:  Prior head CT from 01/25/2020. FINDINGS: Brain: Cerebral volume within normal limits for age. Minimal T2/FLAIR hyperintensity seen involving the periventricular white matter, most like related chronic small vessel ischemic disease, mild for age. No abnormal foci of restricted diffusion to suggest acute or subacute ischemia. Gray-white matter  differentiation maintained. No encephalomalacia to suggest chronic cortical infarction. No foci of susceptibility artifact to suggest acute or chronic intracranial hemorrhage. No mass lesion, midline shift or mass effect. No hydrocephalus. No extra-axial fluid collection. Pituitary gland suprasellar region normal. Midline structures intact. Vascular: Major intracranial vascular flow voids are maintained. Skull and upper cervical spine: Craniocervical junction within normal limits. Bone marrow signal intensity normal. No scalp soft tissue abnormality. Sinuses/Orbits: Globes and orbital soft tissues within normal limits. Mild scattered mucosal thickening noted throughout the paranasal sinuses. Small air-fluid level with pneumatized secretions present within the right sphenoid sinus. No significant mastoid effusion. Inner ear structures grossly normal. Other: None. IMPRESSION: 1. No acute intracranial infarct or other abnormality. 2. Mild age-related cerebral atrophy with chronic small vessel ischemic disease. 3. Acute right sphenoid sinusitis. Electronically Signed   By: Rise Mu M.D.   On: 01/26/2020 01:28   US Carotid Bilateral (at [facility] and AP only)  Result Date: 01/26/2020 CLINICAL DATA:  Weakness of the bilateral lower extremities. History of CAD, hypertension and hyperlipidemia. EXAM: BILATERAL CAROTID DUPLEX ULTRASOUND TECHNIQUE: Wallace Cullens scale imaging, color Doppler and duplex ultrasound were performed of bilateral carotid and vertebral arteries in the neck. COMPARISON:  None. FINDINGS: Examination is degraded due to  patient body habitus and poor sonographic window. Criteria: Quantification of carotid stenosis is based on velocity parameters that correlate the residual internal carotid diameter with NASCET-based stenosis levels, using the diameter of the distal internal carotid lumen as the denominator for stenosis measurement. The following velocity measurements were obtained: RIGHT ICA: 63/15 cm/sec CCA: 63/12 cm/sec SYSTOLIC ICA/CCA RATIO:  1.0 ECA: 32 cm/sec LEFT ICA: 46/15 cm/sec CCA: 66/15 cm/sec SYSTOLIC ICA/CCA RATIO:  0.7 ECA: 62 cm/sec RIGHT CAROTID ARTERY: There is no grayscale evidence of significant intimal thickening or atherosclerotic plaque affecting the interrogated portions of the right carotid system. There are no elevated peak systolic velocities within the interrogated course the right internal carotid artery to suggest a hemodynamically significant stenosis. RIGHT VERTEBRAL ARTERY:  Antegrade flow LEFT CAROTID ARTERY: There is no grayscale evidence of significant intimal thickening or atherosclerotic plaque affecting the interrogated portions of the of the left carotid system. There are no elevated peak systolic velocities within the interrogated course the left internal carotid artery to suggest a hemodynamically significant stenosis. LEFT VERTEBRAL ARTERY:  Antegrade Flow IMPRESSION: No evidence of a hemodynamically significant stenosis affecting either internal carotid artery on this body habitus degraded examination. If clinical concern persists, further evaluation with CTA could be performed as indicated. Electronically Signed   By: Simonne Come M.D.   On: 01/26/2020 09:28   DG Chest Port 1 View  Result Date: 01/25/2020 CLINICAL DATA:  69 year old male status post fall yesterday with hip pain. EXAM: PORTABLE CHEST 1 VIEW COMPARISON:  Chest radiographs.  05/11/2012 and earlier FINDINGS: Portable AP supine view at 1625 hours. Lower lung volumes. Allowing for portable technique the lungs are clear.  Visualized tracheal air column is within normal limits. Mediastinal contours are stable allowing for portable technique, heart size at the upper limits of normal and mildly tortuous thoracic aorta. No acute osseous abnormality identified. Negative visible bowel gas pattern. IMPRESSION: No acute cardiopulmonary abnormality or acute traumatic injury identified. Electronically Signed   By: Odessa Fleming M.D.   On: 01/25/2020 18:45   DG Hip Unilat With Pelvis 2-3 Views Right  Result Date: 01/25/2020 CLINICAL DATA:  69 year old male status post fall yesterday with hip pain. EXAM: DG HIP (WITH OR WITHOUT PELVIS) 2-3V RIGHT  COMPARISON:  None. FINDINGS: Bone mineralization is within normal limits for age. Femoral heads are normally located. Hip joint spaces appear symmetric. Intact pelvis. SI joints appear within normal limits. Grossly intact proximal left femur. AP and frogleg lateral views of the right hip. The visible right femur is intact. Lumbar spine disc and endplate degeneration. Negative visible abdominal and pelvic visceral contours. IMPRESSION: No acute fracture or dislocation identified about the right hip or pelvis. Electronically Signed   By: Genevie Ann M.D.   On: 01/25/2020 18:46   {  Assessment and Plan:   1. Sinus bradycardia, first-degree AV block -Bradycardia likely exacerbated by Toprol-XL 25 mg.This can be held.  Especially as the ejection fraction is normal. -Echocardiogram reviewed at bedside with normal ejection fraction. -No further cardiac intervention indicated or planned at this time.  2.  History of CAD -Continue PTA aspirin, Imdur, Crestor as prescribed  3.  3 of hypertension -BP controlled -Continue PTA losartan, Imdur.  4.  Possible TIA -Work-up and management as per primary team.  Thank you for this consult.  Please let us know if further cardiac input is needed.  Signed, Kate Sable, MD  01/26/2020 10:47 AM

## 2020-01-26 NOTE — Discharge Summary (Signed)
Physician Discharge Summary  Patient ID: Austin Wagner MRN: [id number] DOB/AGE: November 21, 1951 69 y.o.  Admit date: 01/25/2020 Discharge date: 01/26/2020  Admission Diagnoses:  Discharge Diagnoses:  Active Problems:   Weakness   Tachycardia-bradycardia syndrome (HCC)   Coronary artery disease involving native coronary artery of native heart without angina pectoris   Heart block AV complete [facility])   Discharged Condition: fair  Hospital Course:  Mr. Austin Wagner 69 year old male with history of hypertension, hyperlipidemia, CAD who presents to the hospital due to generalized weakness and fainting feeling and dizziness.  Denies chest pain, palpitations, nausea, vomiting, fever, chills.  Upon admission, heart rate was noted to be 55.  Other vital signs are normal.  Cardiology consulted for low heart rates.  Patient takes Toprol-XL 25 mg daily.  Echocardiogram was being performed.   1.  Generalized weakness and expressive dysphasia with inability to ambulate/gait abnormality and presyncope/syncope with fall.   -Tums resolved -Was placed on aspirin and will add Plavix.  - brain MRI without contrast as well as bilateral carotid Doppler and 2D echo with bubble study ---> negative for any acute stroke or source of her stroke or significant stenosis on carotid Doppler -On aspirin  -Fasting lipid panel was fairly normal -Orthostatic vitals were negative for any orthostatic hypotension  -Stop Imitrex; HoldToprol-XL for heart mild bradycardia and hold off Wellbutrin XL that may increase its level and potentially contributing to fall -Cardiology was consulted for bradycardia and possible first-degree AV block.  Cardiology overviewed echocardiogram which shows normal ejection fraction recommended to hold beta-blocker as it may have caused patient's bradycardia.  Also recommended no further cardiac intervention.  2.  Coronary artery disease.    No acute issue  -Continue Toprol-XL  for  bradycardia per cardiology recommendation -  aspirin to be continued  Consults: cardiology  Significant Diagnostic Studies: Urologic imaging and blood work  Treatments: Spittle course and discharge med list  Discharge Exam: Blood pressure 132/76, pulse (!) 55, temperature 98.1 F (36.7 C), temperature source Oral, resp. rate 19, height 5\' 7"  (1.702 m), weight 90.7 kg, SpO2 99 %.  General:  Well nourished, well developed, in no acute distress HEENT: normal Lymph: no adenopathy Neck: no JVD Endocrine:  No thryomegaly Vascular: No carotid bruits; FA pulses 2+ bilaterally without bruits  Cardiac:  normal S1, S2; bradycardic, regular; no murmur  Lungs:  clear to auscultation bilaterally, no wheezing, rhonchi or rales  Abd: soft, nontender, no hepatomegaly  Ext: no edema Musculoskeletal:  No deformities, BUE and BLE strength normal and equal Skin: warm and dry  Neuro:  CNs 2-12 intact, no focal abnormalities noted Psych:  Normal affect   Disposition: Discharge disposition: 06-Home-Health Care Svc     Patient is stable to be discharged home with home health aide and outpatient PT.  Case manager was consulted for arrangement. She was strongly advised to follow-up with his PCP in 1 to 2 weeks. -Continue current medication management -Also patient may follow-up with his cardiologist outpatient -Also ordered 3 in 1 bedside commode as per recommendation -Warning signs and symptoms explained to patient when he must seek immediate medical attention.  Patient expressed understanding.  Discharge Instructions    Call MD for:  difficulty breathing, headache or visual disturbances   Complete by: As directed    Call MD for:  extreme fatigue   Complete by: As directed    Call MD for:  persistant dizziness or light-headedness   Complete by: As directed    Diet -  low sodium heart healthy   Complete by: As directed    Increase activity slowly   Complete by: As directed    As per home  health PT OT   Walk with assistance   Complete by: As directed    As per home health PT OT     Allergies as of 01/26/2020      Reactions   Flomax [tamsulosin]       Medication List    STOP taking these medications   buPROPion 300 MG 24 hr tablet Commonly known as: WELLBUTRIN XL   hydrOXYzine 25 MG tablet Commonly known as: ATARAX/VISTARIL   metoprolol succinate 25 MG 24 hr tablet Commonly known as: TOPROL-XL   naproxen 500 MG tablet Commonly known as: NAPROSYN   SUMAtriptan 50 MG tablet Commonly known as: IMITREX     TAKE these medications   acetaminophen 325 MG tablet Commonly known as: TYLENOL Take 2 tablets (650 mg total) by mouth every 6 (six) hours as needed for mild pain (or temp > 37.5 C (99.5 F)).   albuterol 108 (90 Base) MCG/ACT inhaler Commonly known as: VENTOLIN HFA Inhale 2 puffs into the lungs every 6 (six) hours as needed.   aspirin 81 MG EC tablet Take 1 tablet by mouth daily.   diclofenac Sodium 1 % Gel Commonly known as: VOLTAREN Apply 2 g topically 4 (four) times daily.   fluticasone 50 MCG/ACT nasal spray Commonly known as: FLONASE Place 1 spray into both nostrils daily.   isosorbide mononitrate 30 MG 24 hr tablet Commonly known as: IMDUR Take 60 mg by mouth daily.   losartan 100 MG tablet Commonly known as: COZAAR Take 100 mg by mouth daily.   omeprazole 20 MG capsule Commonly known as: PRILOSEC Take 20 mg by mouth daily.   omeprazole 40 MG capsule Commonly known as: PRILOSEC Take 40 mg by mouth daily.   pregabalin 150 MG capsule Commonly known as: LYRICA Take 150 mg by mouth 2 (two) times daily.   rosuvastatin 40 MG tablet Commonly known as: CRESTOR Take 40 mg by mouth daily.   sertraline 100 MG tablet Commonly known as: ZOLOFT Take 150 mg by mouth daily.   tamsulosin 0.4 MG Caps capsule Commonly known as: FLOMAX Take 0.4 mg by mouth daily.            Durable Medical Equipment  (From admission, onward)          Start     Ordered   01/26/20 1131  For home use only DME Eelevated commode seat  Once    Comments: 3-in-1 Commode   01/26/20 1130         Follow-up Information    F/U PCP. Schedule an appointment as soon as possible for a visit in 2 week(s).   Why: may need to set up a PCP or f/u with existing PCP          Signed: Thomasenia Bottoms 01/26/2020, 4:14 PM

## 2020-01-26 NOTE — Progress Notes (Signed)
Called daughter to let her know her father is ready for pick up for discharge. No answer and no voicemail capabilities.

## 2020-01-26 NOTE — Plan of Care (Signed)
Pt unable to verbalize and understand the risk associated with HTN . Will educate.

## 2020-01-26 NOTE — Plan of Care (Signed)
  Problem: Education: Goal: Knowledge of secondary prevention will improve Outcome: Progressing Goal: Knowledge of patient specific risk factors addressed and post discharge goals established will improve Outcome: Progressing   Problem: Self-Care: Goal: Ability to participate in self-care as condition permits will improve Outcome: Progressing   Problem: Clinical Measurements: Goal: Ability to maintain clinical measurements within normal limits will improve Outcome: Progressing Goal: Cardiovascular complication will be avoided Outcome: Progressing   Problem: Pain Managment: Goal: General experience of comfort will improve Outcome: Progressing   Problem: Safety: Goal: Ability to remain free from injury will improve Outcome: Progressing

## 2020-01-31 DIAGNOSIS — G4733 Obstructive sleep apnea (adult) (pediatric): Secondary | ICD-10-CM | POA: Diagnosis not present

## 2020-02-04 DIAGNOSIS — M79604 Pain in right leg: Secondary | ICD-10-CM | POA: Diagnosis not present

## 2020-02-04 DIAGNOSIS — G8929 Other chronic pain: Secondary | ICD-10-CM | POA: Diagnosis not present

## 2020-02-04 DIAGNOSIS — M5441 Lumbago with sciatica, right side: Secondary | ICD-10-CM | POA: Diagnosis not present

## 2020-02-09 DIAGNOSIS — E663 Overweight: Secondary | ICD-10-CM | POA: Diagnosis not present

## 2020-02-09 DIAGNOSIS — E785 Hyperlipidemia, unspecified: Secondary | ICD-10-CM | POA: Diagnosis not present

## 2020-02-09 DIAGNOSIS — F419 Anxiety disorder, unspecified: Secondary | ICD-10-CM | POA: Diagnosis not present

## 2020-02-09 DIAGNOSIS — R69 Illness, unspecified: Secondary | ICD-10-CM | POA: Diagnosis not present

## 2020-02-09 DIAGNOSIS — J309 Allergic rhinitis, unspecified: Secondary | ICD-10-CM | POA: Diagnosis not present

## 2020-02-09 DIAGNOSIS — I2510 Atherosclerotic heart disease of native coronary artery without angina pectoris: Secondary | ICD-10-CM | POA: Diagnosis not present

## 2020-02-09 DIAGNOSIS — Z008 Encounter for other general examination: Secondary | ICD-10-CM | POA: Diagnosis not present

## 2020-02-09 DIAGNOSIS — K219 Gastro-esophageal reflux disease without esophagitis: Secondary | ICD-10-CM | POA: Diagnosis not present

## 2020-02-09 DIAGNOSIS — I10 Essential (primary) hypertension: Secondary | ICD-10-CM | POA: Diagnosis not present

## 2020-02-09 DIAGNOSIS — K5900 Constipation, unspecified: Secondary | ICD-10-CM | POA: Diagnosis not present

## 2020-02-09 DIAGNOSIS — G629 Polyneuropathy, unspecified: Secondary | ICD-10-CM | POA: Diagnosis not present

## 2020-02-12 DIAGNOSIS — M79604 Pain in right leg: Secondary | ICD-10-CM | POA: Diagnosis not present

## 2020-02-12 DIAGNOSIS — G8929 Other chronic pain: Secondary | ICD-10-CM | POA: Diagnosis not present

## 2020-02-12 DIAGNOSIS — M4316 Spondylolisthesis, lumbar region: Secondary | ICD-10-CM | POA: Diagnosis not present

## 2020-02-12 DIAGNOSIS — M5126 Other intervertebral disc displacement, lumbar region: Secondary | ICD-10-CM | POA: Diagnosis not present

## 2020-02-12 DIAGNOSIS — M5441 Lumbago with sciatica, right side: Secondary | ICD-10-CM | POA: Diagnosis not present

## 2020-02-12 DIAGNOSIS — M48061 Spinal stenosis, lumbar region without neurogenic claudication: Secondary | ICD-10-CM | POA: Diagnosis not present

## 2020-02-12 DIAGNOSIS — M47816 Spondylosis without myelopathy or radiculopathy, lumbar region: Secondary | ICD-10-CM | POA: Diagnosis not present

## 2020-02-12 DIAGNOSIS — M5136 Other intervertebral disc degeneration, lumbar region: Secondary | ICD-10-CM | POA: Diagnosis not present

## 2020-02-20 DIAGNOSIS — M5441 Lumbago with sciatica, right side: Secondary | ICD-10-CM | POA: Diagnosis not present

## 2020-02-20 DIAGNOSIS — R69 Illness, unspecified: Secondary | ICD-10-CM | POA: Diagnosis not present

## 2020-02-20 DIAGNOSIS — G8929 Other chronic pain: Secondary | ICD-10-CM | POA: Diagnosis not present

## 2020-02-25 DIAGNOSIS — M79604 Pain in right leg: Secondary | ICD-10-CM | POA: Diagnosis not present

## 2020-02-25 DIAGNOSIS — M5441 Lumbago with sciatica, right side: Secondary | ICD-10-CM | POA: Diagnosis not present

## 2020-02-25 DIAGNOSIS — G8929 Other chronic pain: Secondary | ICD-10-CM | POA: Diagnosis not present

## 2020-02-27 DIAGNOSIS — G8929 Other chronic pain: Secondary | ICD-10-CM | POA: Diagnosis not present

## 2020-02-27 DIAGNOSIS — M5441 Lumbago with sciatica, right side: Secondary | ICD-10-CM | POA: Diagnosis not present

## 2020-03-06 DIAGNOSIS — M5441 Lumbago with sciatica, right side: Secondary | ICD-10-CM | POA: Diagnosis not present

## 2020-03-06 DIAGNOSIS — M79604 Pain in right leg: Secondary | ICD-10-CM | POA: Diagnosis not present

## 2020-03-06 DIAGNOSIS — G8929 Other chronic pain: Secondary | ICD-10-CM | POA: Diagnosis not present

## 2020-03-10 DIAGNOSIS — M5126 Other intervertebral disc displacement, lumbar region: Secondary | ICD-10-CM | POA: Diagnosis not present

## 2020-03-10 DIAGNOSIS — M5136 Other intervertebral disc degeneration, lumbar region: Secondary | ICD-10-CM | POA: Diagnosis not present

## 2020-03-10 DIAGNOSIS — M545 Low back pain: Secondary | ICD-10-CM | POA: Diagnosis not present

## 2020-03-10 DIAGNOSIS — M4316 Spondylolisthesis, lumbar region: Secondary | ICD-10-CM | POA: Diagnosis not present

## 2020-03-10 DIAGNOSIS — M62830 Muscle spasm of back: Secondary | ICD-10-CM | POA: Diagnosis not present

## 2020-03-17 DIAGNOSIS — G8929 Other chronic pain: Secondary | ICD-10-CM | POA: Diagnosis not present

## 2020-03-17 DIAGNOSIS — I2510 Atherosclerotic heart disease of native coronary artery without angina pectoris: Secondary | ICD-10-CM | POA: Diagnosis not present

## 2020-03-17 DIAGNOSIS — M5441 Lumbago with sciatica, right side: Secondary | ICD-10-CM | POA: Diagnosis not present

## 2020-03-17 DIAGNOSIS — G44219 Episodic tension-type headache, not intractable: Secondary | ICD-10-CM | POA: Diagnosis not present

## 2020-03-19 DIAGNOSIS — G8929 Other chronic pain: Secondary | ICD-10-CM | POA: Diagnosis not present

## 2020-03-19 DIAGNOSIS — M5441 Lumbago with sciatica, right side: Secondary | ICD-10-CM | POA: Diagnosis not present

## 2020-03-20 DIAGNOSIS — M79604 Pain in right leg: Secondary | ICD-10-CM | POA: Diagnosis not present

## 2020-03-20 DIAGNOSIS — G8929 Other chronic pain: Secondary | ICD-10-CM | POA: Diagnosis not present

## 2020-03-20 DIAGNOSIS — M5441 Lumbago with sciatica, right side: Secondary | ICD-10-CM | POA: Diagnosis not present

## 2020-03-27 DIAGNOSIS — M79604 Pain in right leg: Secondary | ICD-10-CM | POA: Diagnosis not present

## 2020-03-27 DIAGNOSIS — G8929 Other chronic pain: Secondary | ICD-10-CM | POA: Diagnosis not present

## 2020-03-27 DIAGNOSIS — M5441 Lumbago with sciatica, right side: Secondary | ICD-10-CM | POA: Diagnosis not present

## 2020-04-17 DIAGNOSIS — M79604 Pain in right leg: Secondary | ICD-10-CM | POA: Diagnosis not present

## 2020-04-17 DIAGNOSIS — M5441 Lumbago with sciatica, right side: Secondary | ICD-10-CM | POA: Diagnosis not present

## 2020-04-17 DIAGNOSIS — G8929 Other chronic pain: Secondary | ICD-10-CM | POA: Diagnosis not present

## 2020-04-24 DIAGNOSIS — M79604 Pain in right leg: Secondary | ICD-10-CM | POA: Diagnosis not present

## 2020-04-24 DIAGNOSIS — G8929 Other chronic pain: Secondary | ICD-10-CM | POA: Diagnosis not present

## 2020-04-24 DIAGNOSIS — M5441 Lumbago with sciatica, right side: Secondary | ICD-10-CM | POA: Diagnosis not present

## 2020-04-28 DIAGNOSIS — M5416 Radiculopathy, lumbar region: Secondary | ICD-10-CM | POA: Diagnosis not present

## 2020-04-28 DIAGNOSIS — G8929 Other chronic pain: Secondary | ICD-10-CM | POA: Diagnosis not present

## 2020-04-28 DIAGNOSIS — M5441 Lumbago with sciatica, right side: Secondary | ICD-10-CM | POA: Diagnosis not present

## 2020-05-14 DIAGNOSIS — M5416 Radiculopathy, lumbar region: Secondary | ICD-10-CM | POA: Diagnosis not present

## 2020-05-14 DIAGNOSIS — M4726 Other spondylosis with radiculopathy, lumbar region: Secondary | ICD-10-CM | POA: Diagnosis not present

## 2020-05-14 DIAGNOSIS — M48061 Spinal stenosis, lumbar region without neurogenic claudication: Secondary | ICD-10-CM | POA: Diagnosis not present

## 2020-05-14 DIAGNOSIS — M4807 Spinal stenosis, lumbosacral region: Secondary | ICD-10-CM | POA: Diagnosis not present

## 2020-05-29 DIAGNOSIS — M25512 Pain in left shoulder: Secondary | ICD-10-CM | POA: Diagnosis not present

## 2020-05-29 DIAGNOSIS — M25569 Pain in unspecified knee: Secondary | ICD-10-CM | POA: Diagnosis not present

## 2020-05-29 DIAGNOSIS — M228X9 Other disorders of patella, unspecified knee: Secondary | ICD-10-CM | POA: Diagnosis not present

## 2020-05-29 DIAGNOSIS — M7542 Impingement syndrome of left shoulder: Secondary | ICD-10-CM | POA: Diagnosis not present

## 2020-05-29 DIAGNOSIS — M1990 Unspecified osteoarthritis, unspecified site: Secondary | ICD-10-CM | POA: Diagnosis not present

## 2020-06-01 DIAGNOSIS — M7542 Impingement syndrome of left shoulder: Secondary | ICD-10-CM | POA: Insufficient documentation

## 2020-06-01 DIAGNOSIS — M228X9 Other disorders of patella, unspecified knee: Secondary | ICD-10-CM | POA: Insufficient documentation

## 2020-07-17 DIAGNOSIS — I10 Essential (primary) hypertension: Secondary | ICD-10-CM | POA: Diagnosis not present

## 2020-07-17 DIAGNOSIS — I5032 Chronic diastolic (congestive) heart failure: Secondary | ICD-10-CM | POA: Diagnosis not present

## 2020-07-18 DIAGNOSIS — M19071 Primary osteoarthritis, right ankle and foot: Secondary | ICD-10-CM | POA: Diagnosis not present

## 2020-07-18 DIAGNOSIS — M79671 Pain in right foot: Secondary | ICD-10-CM | POA: Diagnosis not present

## 2020-07-28 DIAGNOSIS — I951 Orthostatic hypotension: Secondary | ICD-10-CM | POA: Diagnosis not present

## 2020-07-28 DIAGNOSIS — I5032 Chronic diastolic (congestive) heart failure: Secondary | ICD-10-CM | POA: Diagnosis not present

## 2020-07-30 DIAGNOSIS — I10 Essential (primary) hypertension: Secondary | ICD-10-CM | POA: Diagnosis not present

## 2020-07-30 DIAGNOSIS — G4733 Obstructive sleep apnea (adult) (pediatric): Secondary | ICD-10-CM | POA: Diagnosis not present

## 2020-08-27 DIAGNOSIS — G4733 Obstructive sleep apnea (adult) (pediatric): Secondary | ICD-10-CM | POA: Diagnosis not present

## 2020-09-19 DIAGNOSIS — M5441 Lumbago with sciatica, right side: Secondary | ICD-10-CM | POA: Diagnosis not present

## 2020-09-19 DIAGNOSIS — Z23 Encounter for immunization: Secondary | ICD-10-CM | POA: Diagnosis not present

## 2020-09-19 DIAGNOSIS — G8929 Other chronic pain: Secondary | ICD-10-CM | POA: Diagnosis not present

## 2020-09-19 DIAGNOSIS — M7542 Impingement syndrome of left shoulder: Secondary | ICD-10-CM | POA: Diagnosis not present

## 2020-11-05 DIAGNOSIS — M19012 Primary osteoarthritis, left shoulder: Secondary | ICD-10-CM | POA: Diagnosis not present

## 2020-11-05 DIAGNOSIS — M7582 Other shoulder lesions, left shoulder: Secondary | ICD-10-CM | POA: Diagnosis not present

## 2020-11-05 DIAGNOSIS — S46812A Strain of other muscles, fascia and tendons at shoulder and upper arm level, left arm, initial encounter: Secondary | ICD-10-CM | POA: Diagnosis not present

## 2020-11-05 DIAGNOSIS — R937 Abnormal findings on diagnostic imaging of other parts of musculoskeletal system: Secondary | ICD-10-CM | POA: Diagnosis not present

## 2020-11-05 DIAGNOSIS — M67814 Other specified disorders of tendon, left shoulder: Secondary | ICD-10-CM | POA: Diagnosis not present

## 2020-11-05 DIAGNOSIS — M7542 Impingement syndrome of left shoulder: Secondary | ICD-10-CM | POA: Diagnosis not present

## 2020-11-05 DIAGNOSIS — M75112 Incomplete rotator cuff tear or rupture of left shoulder, not specified as traumatic: Secondary | ICD-10-CM | POA: Diagnosis not present

## 2020-11-10 DIAGNOSIS — I10 Essential (primary) hypertension: Secondary | ICD-10-CM | POA: Diagnosis not present

## 2020-11-10 DIAGNOSIS — M5441 Lumbago with sciatica, right side: Secondary | ICD-10-CM | POA: Diagnosis not present

## 2020-11-10 DIAGNOSIS — G8929 Other chronic pain: Secondary | ICD-10-CM | POA: Diagnosis not present

## 2020-11-10 DIAGNOSIS — H9193 Unspecified hearing loss, bilateral: Secondary | ICD-10-CM | POA: Diagnosis not present

## 2020-11-10 DIAGNOSIS — R0789 Other chest pain: Secondary | ICD-10-CM | POA: Diagnosis not present

## 2020-11-10 DIAGNOSIS — I2510 Atherosclerotic heart disease of native coronary artery without angina pectoris: Secondary | ICD-10-CM | POA: Diagnosis not present

## 2020-11-10 DIAGNOSIS — I088 Other rheumatic multiple valve diseases: Secondary | ICD-10-CM | POA: Diagnosis not present

## 2020-11-10 DIAGNOSIS — I5032 Chronic diastolic (congestive) heart failure: Secondary | ICD-10-CM | POA: Diagnosis not present

## 2020-11-10 DIAGNOSIS — M75112 Incomplete rotator cuff tear or rupture of left shoulder, not specified as traumatic: Secondary | ICD-10-CM | POA: Diagnosis not present

## 2020-11-11 DIAGNOSIS — G4733 Obstructive sleep apnea (adult) (pediatric): Secondary | ICD-10-CM | POA: Diagnosis not present

## 2020-11-17 DIAGNOSIS — R0789 Other chest pain: Secondary | ICD-10-CM | POA: Diagnosis not present

## 2020-11-17 DIAGNOSIS — R079 Chest pain, unspecified: Secondary | ICD-10-CM | POA: Diagnosis not present

## 2020-11-17 DIAGNOSIS — R931 Abnormal findings on diagnostic imaging of heart and coronary circulation: Secondary | ICD-10-CM | POA: Diagnosis not present

## 2020-11-17 DIAGNOSIS — Z9861 Coronary angioplasty status: Secondary | ICD-10-CM | POA: Diagnosis not present

## 2020-11-17 DIAGNOSIS — I2510 Atherosclerotic heart disease of native coronary artery without angina pectoris: Secondary | ICD-10-CM | POA: Diagnosis not present

## 2020-12-02 DIAGNOSIS — H11001 Unspecified pterygium of right eye: Secondary | ICD-10-CM | POA: Insufficient documentation

## 2020-12-02 DIAGNOSIS — H524 Presbyopia: Secondary | ICD-10-CM | POA: Insufficient documentation

## 2020-12-05 DIAGNOSIS — N400 Enlarged prostate without lower urinary tract symptoms: Secondary | ICD-10-CM | POA: Diagnosis not present

## 2020-12-05 DIAGNOSIS — R918 Other nonspecific abnormal finding of lung field: Secondary | ICD-10-CM | POA: Diagnosis not present

## 2020-12-05 DIAGNOSIS — I959 Hypotension, unspecified: Secondary | ICD-10-CM | POA: Diagnosis not present

## 2020-12-05 DIAGNOSIS — R4182 Altered mental status, unspecified: Secondary | ICD-10-CM | POA: Diagnosis not present

## 2020-12-05 DIAGNOSIS — R42 Dizziness and giddiness: Secondary | ICD-10-CM | POA: Diagnosis not present

## 2020-12-05 DIAGNOSIS — M545 Low back pain, unspecified: Secondary | ICD-10-CM | POA: Diagnosis not present

## 2020-12-05 DIAGNOSIS — E162 Hypoglycemia, unspecified: Secondary | ICD-10-CM | POA: Diagnosis not present

## 2020-12-05 DIAGNOSIS — K219 Gastro-esophageal reflux disease without esophagitis: Secondary | ICD-10-CM | POA: Diagnosis not present

## 2020-12-05 DIAGNOSIS — R55 Syncope and collapse: Secondary | ICD-10-CM | POA: Diagnosis not present

## 2020-12-05 DIAGNOSIS — R791 Abnormal coagulation profile: Secondary | ICD-10-CM | POA: Diagnosis not present

## 2020-12-05 DIAGNOSIS — R531 Weakness: Secondary | ICD-10-CM | POA: Diagnosis not present

## 2020-12-05 DIAGNOSIS — I2510 Atherosclerotic heart disease of native coronary artery without angina pectoris: Secondary | ICD-10-CM | POA: Diagnosis not present

## 2020-12-05 DIAGNOSIS — G4733 Obstructive sleep apnea (adult) (pediatric): Secondary | ICD-10-CM | POA: Diagnosis not present

## 2020-12-05 DIAGNOSIS — E11649 Type 2 diabetes mellitus with hypoglycemia without coma: Secondary | ICD-10-CM | POA: Diagnosis not present

## 2020-12-05 DIAGNOSIS — I951 Orthostatic hypotension: Secondary | ICD-10-CM | POA: Diagnosis not present

## 2020-12-05 DIAGNOSIS — I10 Essential (primary) hypertension: Secondary | ICD-10-CM | POA: Diagnosis not present

## 2020-12-05 DIAGNOSIS — R69 Illness, unspecified: Secondary | ICD-10-CM | POA: Diagnosis not present

## 2020-12-12 DIAGNOSIS — E782 Mixed hyperlipidemia: Secondary | ICD-10-CM | POA: Diagnosis not present

## 2020-12-12 DIAGNOSIS — I2510 Atherosclerotic heart disease of native coronary artery without angina pectoris: Secondary | ICD-10-CM | POA: Diagnosis not present

## 2020-12-12 DIAGNOSIS — I5032 Chronic diastolic (congestive) heart failure: Secondary | ICD-10-CM | POA: Diagnosis not present

## 2020-12-12 DIAGNOSIS — M7542 Impingement syndrome of left shoulder: Secondary | ICD-10-CM | POA: Diagnosis not present

## 2020-12-12 DIAGNOSIS — I10 Essential (primary) hypertension: Secondary | ICD-10-CM | POA: Diagnosis not present

## 2020-12-12 DIAGNOSIS — R8271 Bacteriuria: Secondary | ICD-10-CM | POA: Diagnosis not present

## 2020-12-12 DIAGNOSIS — H9193 Unspecified hearing loss, bilateral: Secondary | ICD-10-CM | POA: Diagnosis not present

## 2020-12-12 DIAGNOSIS — R69 Illness, unspecified: Secondary | ICD-10-CM | POA: Diagnosis not present

## 2020-12-12 DIAGNOSIS — M5441 Lumbago with sciatica, right side: Secondary | ICD-10-CM | POA: Diagnosis not present

## 2020-12-13 DIAGNOSIS — F32A Depression, unspecified: Secondary | ICD-10-CM | POA: Insufficient documentation

## 2020-12-13 DIAGNOSIS — A491 Streptococcal infection, unspecified site: Secondary | ICD-10-CM | POA: Diagnosis not present

## 2020-12-14 DIAGNOSIS — I2510 Atherosclerotic heart disease of native coronary artery without angina pectoris: Secondary | ICD-10-CM | POA: Diagnosis not present

## 2020-12-14 DIAGNOSIS — S83412A Sprain of medial collateral ligament of left knee, initial encounter: Secondary | ICD-10-CM | POA: Diagnosis not present

## 2020-12-14 DIAGNOSIS — X500XXA Overexertion from strenuous movement or load, initial encounter: Secondary | ICD-10-CM | POA: Diagnosis not present

## 2020-12-14 DIAGNOSIS — R69 Illness, unspecified: Secondary | ICD-10-CM | POA: Diagnosis not present

## 2020-12-14 DIAGNOSIS — M25462 Effusion, left knee: Secondary | ICD-10-CM | POA: Diagnosis not present

## 2020-12-14 DIAGNOSIS — M25562 Pain in left knee: Secondary | ICD-10-CM | POA: Diagnosis not present

## 2020-12-14 DIAGNOSIS — H9190 Unspecified hearing loss, unspecified ear: Secondary | ICD-10-CM | POA: Diagnosis not present

## 2020-12-14 DIAGNOSIS — Z7982 Long term (current) use of aspirin: Secondary | ICD-10-CM | POA: Diagnosis not present

## 2020-12-14 DIAGNOSIS — E785 Hyperlipidemia, unspecified: Secondary | ICD-10-CM | POA: Diagnosis not present

## 2020-12-14 DIAGNOSIS — M545 Low back pain, unspecified: Secondary | ICD-10-CM | POA: Diagnosis not present

## 2021-02-04 DIAGNOSIS — H903 Sensorineural hearing loss, bilateral: Secondary | ICD-10-CM | POA: Diagnosis not present

## 2021-02-04 DIAGNOSIS — H9313 Tinnitus, bilateral: Secondary | ICD-10-CM | POA: Diagnosis not present

## 2021-02-04 DIAGNOSIS — H6122 Impacted cerumen, left ear: Secondary | ICD-10-CM | POA: Diagnosis not present

## 2021-02-09 IMAGING — MR MR HEAD W/O CM
11 series · 46 of 48 positions shown · non-contrast
Comparison: Prior head CT from 01/25/2020.

CLINICAL DATA: Initial evaluation for acute onset generalized
weakness, inability to walk.

EXAM:
MRI HEAD WITHOUT CONTRAST
TECHNIQUE: Multiplanar, multiecho pulse sequences of the brain and surrounding
structures were obtained without intravenous contrast.

[Series 5: ax dwi_tracew · axial · 3.0mm · 0.60mm/px · z∈[-108,+47]mm · 5 of 48 slices shown]
[im 1/48]
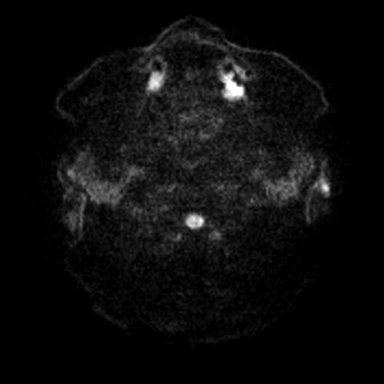
[im 12/48]
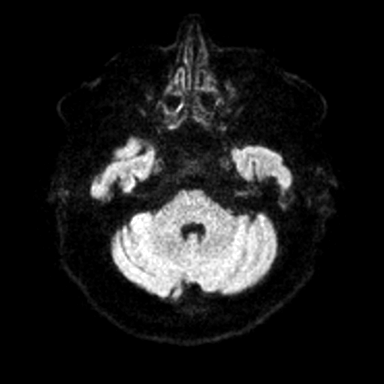
[im 24/48]
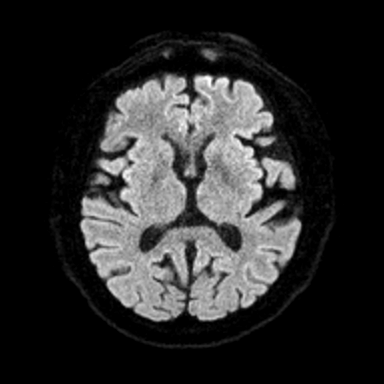
[im 36/48]
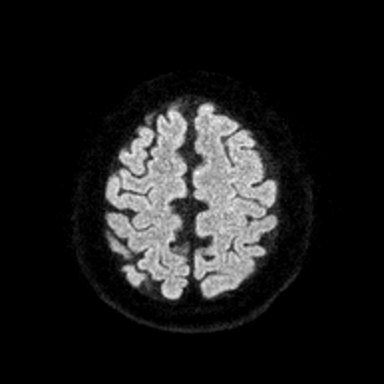
[im 48/48]
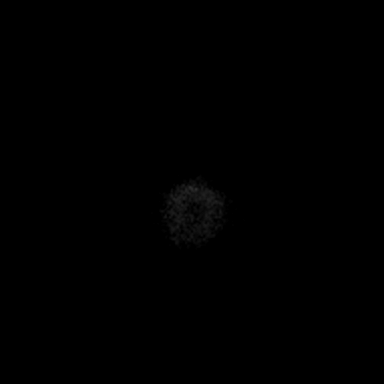

[Series 6: ax dwi_adc · axial · 3.0mm · 0.60mm/px · z∈[-108,+47]mm · 4 of 48 slices shown]
[im 1/48]
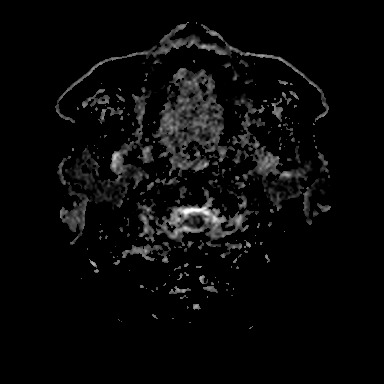
[im 16/48]
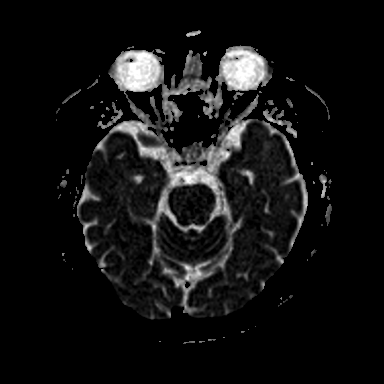
[im 32/48]
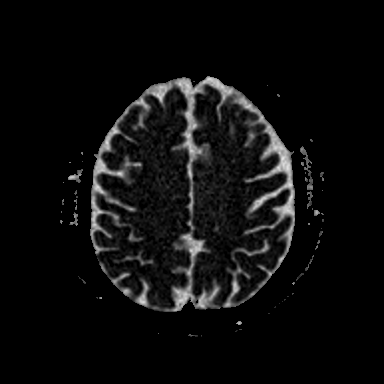
[im 48/48]
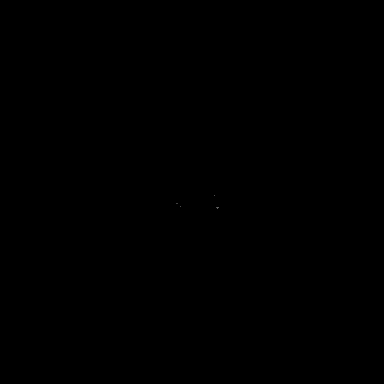

[Series 7: cor dwi_tracew · coronal · 5.0mm · 0.60mm/px · 3 of 36 slices shown]
[im 1/36]
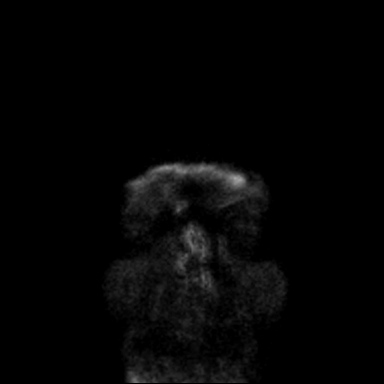
[im 18/36]
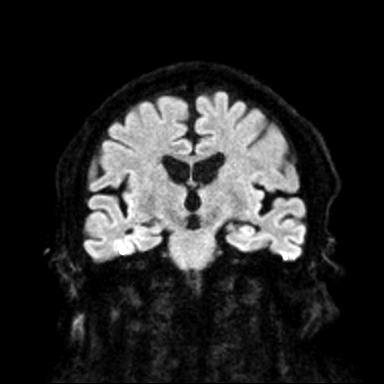
[im 36/36]
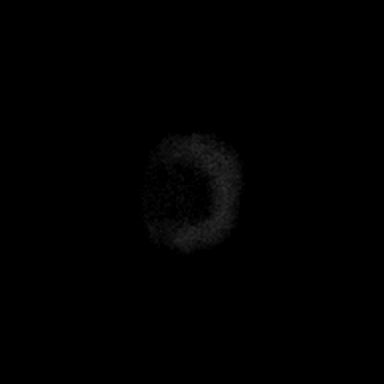

[Series 8: cor dwi_adc · coronal · 5.0mm · 0.60mm/px · 3 of 36 slices shown]
[im 1/36]
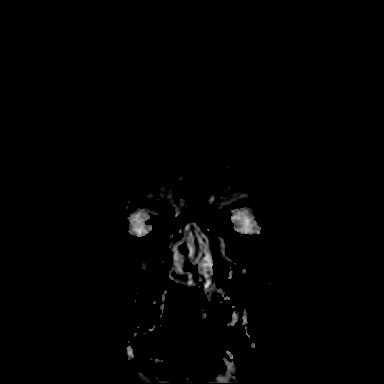
[im 18/36]
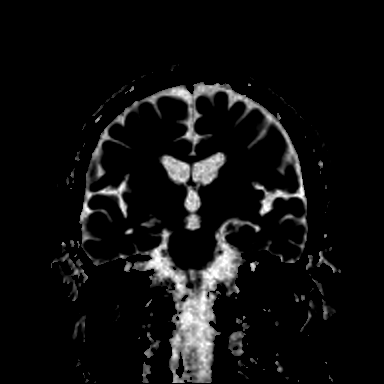
[im 36/36]
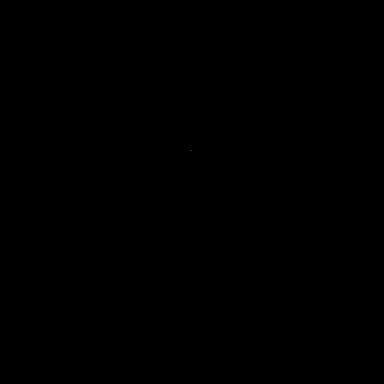

[Series 9: T1 · sagittal · 5.0mm · 0.62mm/px · 2 of 23 slices shown (1 of 2)]
[im 1/23]
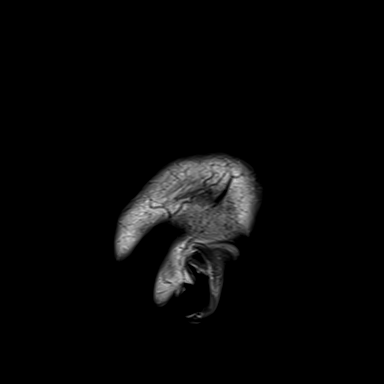
[im 23/23]
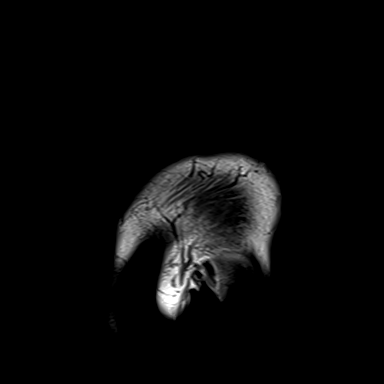

[Series 10: T2 · axial · 5.0mm · 0.53mm/px · z∈[-112,+44]mm · 2 of 27 slices shown (1 of 2)]
[im 1/27]
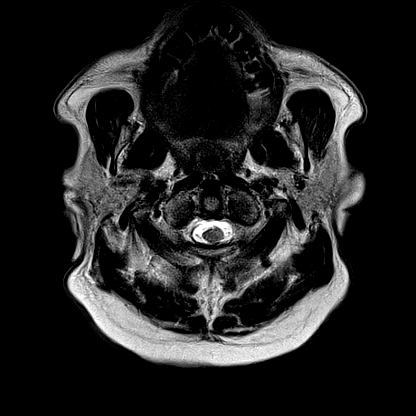
[im 27/27]
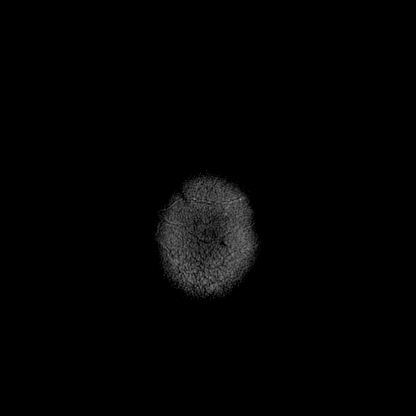

[Series 12: pha_images · axial · 3.0mm · 0.90mm/px · z∈[-120,+54]mm · 5 of 59 slices shown]
[im 1/59]
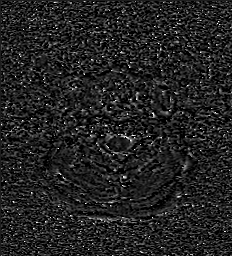
[im 15/59]
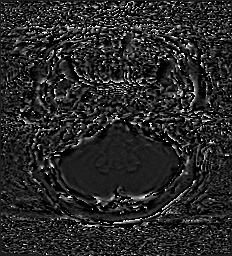
[im 30/59]
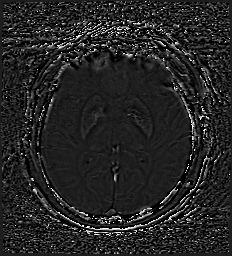
[im 44/59]
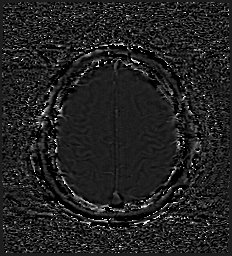
[im 59/59]
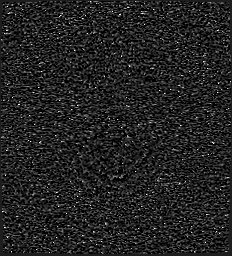

[Series 13: swi_images · axial · 3.0mm · 0.90mm/px · z∈[-123,+54]mm · 5 of 60 slices shown]
[im 1/60]
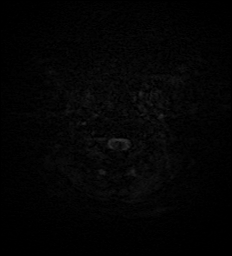
[im 15/60]
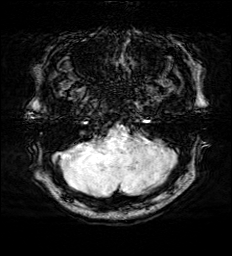
[im 30/60]
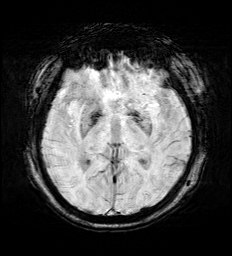
[im 45/60]
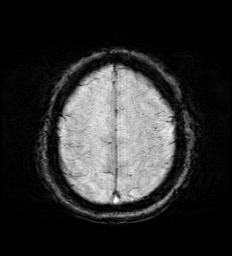
[im 60/60]
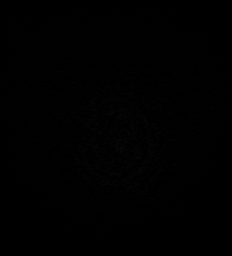

[Series 16: T1 · axial · 1.0mm · 0.98mm/px · z∈[-113,+46]mm · 11 of 160 slices shown (2 of 2)]
[im 1/160]
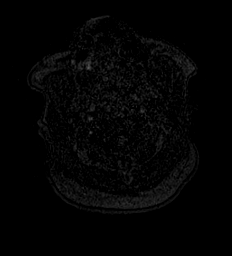
[im 14/160]
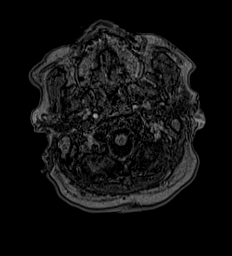
[im 27/160]
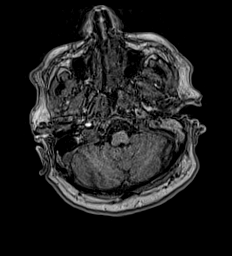
[im 40/160]
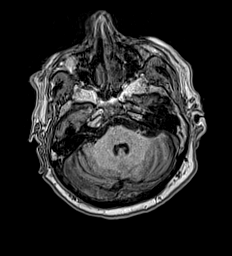
[im 54/160]
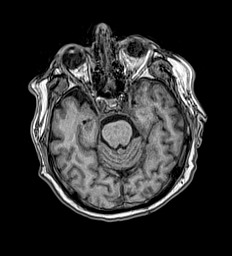
[im 67/160]
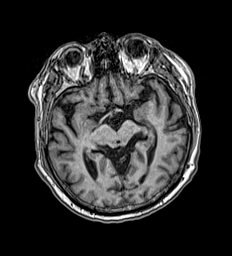
[im 80/160]
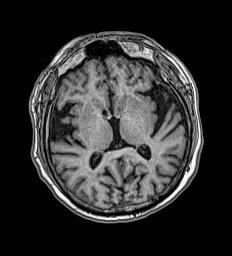
[im 93/160]
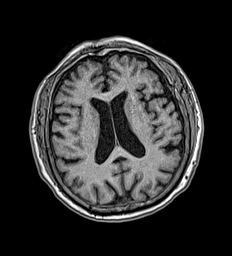
[im 107/160]
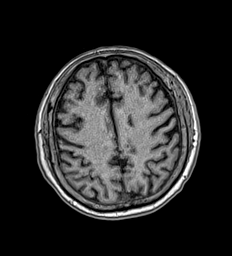
[im 133/160]
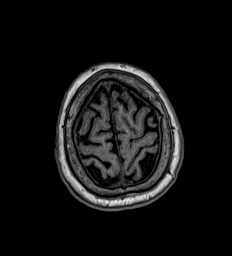
[im 160/160]
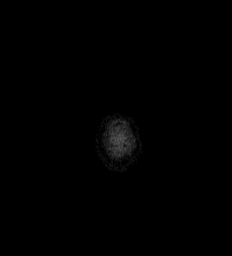

[Series 17: T2 · coronal · 5.0mm · 0.45mm/px · 2 of 26 slices shown (2 of 2)]
[im 1/26]
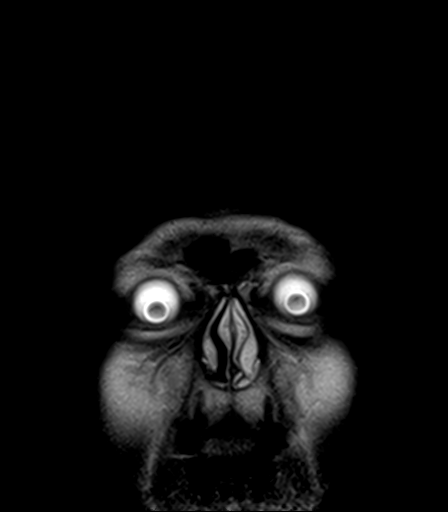
[im 26/26]
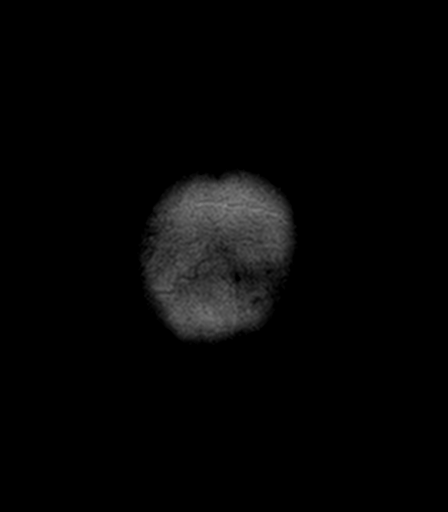

[Series 18: FLAIR · axial · 3.0mm · 1.20mm/px · z∈[-112,+44]mm · 4 of 53 slices shown]
[im 1/53]
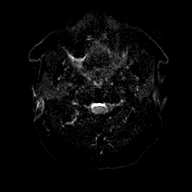
[im 18/53]
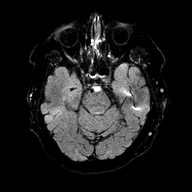
[im 35/53]
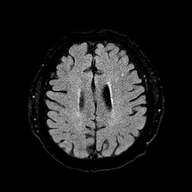
[im 53/53]
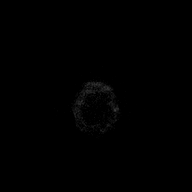

[46 of 48 positions shown; findings below may reference images not displayed]

FINDINGS: Brain: Cerebral volume within normal limits for age. Minimal
T2/FLAIR hyperintensity seen involving the periventricular white
matter, most like related chronic small vessel ischemic disease,
mild for age.

No abnormal foci of restricted diffusion to suggest acute or
subacute ischemia. Gray-white matter differentiation maintained. No
encephalomalacia to suggest chronic cortical infarction. No foci of
susceptibility artifact to suggest acute or chronic intracranial
hemorrhage.

No mass lesion, midline shift or mass effect. No hydrocephalus. No
extra-axial fluid collection. Pituitary gland suprasellar region
normal. Midline structures intact.

Vascular: Major intracranial vascular flow voids are maintained.

Skull and upper cervical spine: Craniocervical junction within
normal limits. Bone marrow signal intensity normal. No scalp soft
tissue abnormality.

Sinuses/Orbits: Globes and orbital soft tissues within normal
limits. Mild scattered mucosal thickening noted throughout the
paranasal sinuses. Small air-fluid level with pneumatized secretions
present within the right sphenoid sinus. No significant mastoid
effusion. Inner ear structures grossly normal.

Other: None.
IMPRESSION: 1. No acute intracranial infarct or other abnormality.
2. Mild age-related cerebral atrophy with chronic small vessel
ischemic disease.
3. Acute right sphenoid sinusitis.

## 2021-02-12 DIAGNOSIS — H9193 Unspecified hearing loss, bilateral: Secondary | ICD-10-CM | POA: Diagnosis not present

## 2021-02-12 DIAGNOSIS — R69 Illness, unspecified: Secondary | ICD-10-CM | POA: Diagnosis not present

## 2021-02-12 DIAGNOSIS — I10 Essential (primary) hypertension: Secondary | ICD-10-CM | POA: Diagnosis not present

## 2021-02-12 DIAGNOSIS — I2510 Atherosclerotic heart disease of native coronary artery without angina pectoris: Secondary | ICD-10-CM | POA: Diagnosis not present

## 2021-02-12 DIAGNOSIS — E782 Mixed hyperlipidemia: Secondary | ICD-10-CM | POA: Diagnosis not present

## 2021-02-12 DIAGNOSIS — M5441 Lumbago with sciatica, right side: Secondary | ICD-10-CM | POA: Diagnosis not present

## 2021-02-12 DIAGNOSIS — M7542 Impingement syndrome of left shoulder: Secondary | ICD-10-CM | POA: Diagnosis not present

## 2021-02-12 DIAGNOSIS — I5032 Chronic diastolic (congestive) heart failure: Secondary | ICD-10-CM | POA: Diagnosis not present

## 2021-02-12 DIAGNOSIS — R8271 Bacteriuria: Secondary | ICD-10-CM | POA: Diagnosis not present

## 2021-03-18 DIAGNOSIS — G4733 Obstructive sleep apnea (adult) (pediatric): Secondary | ICD-10-CM | POA: Diagnosis not present

## 2021-04-01 DIAGNOSIS — I080 Rheumatic disorders of both mitral and aortic valves: Secondary | ICD-10-CM | POA: Diagnosis not present

## 2021-04-01 DIAGNOSIS — I5032 Chronic diastolic (congestive) heart failure: Secondary | ICD-10-CM | POA: Diagnosis not present

## 2021-04-01 DIAGNOSIS — R8271 Bacteriuria: Secondary | ICD-10-CM | POA: Diagnosis not present

## 2021-04-30 DIAGNOSIS — G8929 Other chronic pain: Secondary | ICD-10-CM | POA: Diagnosis not present

## 2021-04-30 DIAGNOSIS — I2510 Atherosclerotic heart disease of native coronary artery without angina pectoris: Secondary | ICD-10-CM | POA: Diagnosis not present

## 2021-04-30 DIAGNOSIS — I5032 Chronic diastolic (congestive) heart failure: Secondary | ICD-10-CM | POA: Diagnosis not present

## 2021-04-30 DIAGNOSIS — R69 Illness, unspecified: Secondary | ICD-10-CM | POA: Diagnosis not present

## 2021-04-30 DIAGNOSIS — I110 Hypertensive heart disease with heart failure: Secondary | ICD-10-CM | POA: Diagnosis not present

## 2021-04-30 DIAGNOSIS — M5441 Lumbago with sciatica, right side: Secondary | ICD-10-CM | POA: Diagnosis not present

## 2021-04-30 DIAGNOSIS — R8271 Bacteriuria: Secondary | ICD-10-CM | POA: Diagnosis not present

## 2021-06-09 DIAGNOSIS — I110 Hypertensive heart disease with heart failure: Secondary | ICD-10-CM | POA: Diagnosis not present

## 2021-06-23 DIAGNOSIS — G4733 Obstructive sleep apnea (adult) (pediatric): Secondary | ICD-10-CM | POA: Diagnosis not present

## 2021-07-09 DIAGNOSIS — M5412 Radiculopathy, cervical region: Secondary | ICD-10-CM | POA: Diagnosis not present

## 2021-07-09 DIAGNOSIS — M5416 Radiculopathy, lumbar region: Secondary | ICD-10-CM | POA: Diagnosis not present

## 2021-07-09 DIAGNOSIS — M48062 Spinal stenosis, lumbar region with neurogenic claudication: Secondary | ICD-10-CM | POA: Diagnosis not present

## 2021-07-09 DIAGNOSIS — M7552 Bursitis of left shoulder: Secondary | ICD-10-CM | POA: Diagnosis not present

## 2021-07-30 DIAGNOSIS — M48062 Spinal stenosis, lumbar region with neurogenic claudication: Secondary | ICD-10-CM | POA: Diagnosis not present

## 2021-07-30 DIAGNOSIS — M5412 Radiculopathy, cervical region: Secondary | ICD-10-CM | POA: Diagnosis not present

## 2021-07-30 DIAGNOSIS — R2689 Other abnormalities of gait and mobility: Secondary | ICD-10-CM | POA: Diagnosis not present

## 2021-07-30 DIAGNOSIS — M5416 Radiculopathy, lumbar region: Secondary | ICD-10-CM | POA: Diagnosis not present

## 2021-08-13 DIAGNOSIS — M48062 Spinal stenosis, lumbar region with neurogenic claudication: Secondary | ICD-10-CM | POA: Diagnosis not present

## 2021-08-13 DIAGNOSIS — M5010 Cervical disc disorder with radiculopathy, unspecified cervical region: Secondary | ICD-10-CM | POA: Diagnosis not present

## 2021-08-13 DIAGNOSIS — M4726 Other spondylosis with radiculopathy, lumbar region: Secondary | ICD-10-CM | POA: Diagnosis not present

## 2021-08-13 DIAGNOSIS — M4802 Spinal stenosis, cervical region: Secondary | ICD-10-CM | POA: Diagnosis not present

## 2021-08-13 DIAGNOSIS — G8929 Other chronic pain: Secondary | ICD-10-CM | POA: Diagnosis not present

## 2021-08-13 DIAGNOSIS — M4858XA Collapsed vertebra, not elsewhere classified, sacral and sacrococcygeal region, initial encounter for fracture: Secondary | ICD-10-CM | POA: Diagnosis not present

## 2021-08-13 DIAGNOSIS — M4727 Other spondylosis with radiculopathy, lumbosacral region: Secondary | ICD-10-CM | POA: Diagnosis not present

## 2021-08-13 DIAGNOSIS — R2681 Unsteadiness on feet: Secondary | ICD-10-CM | POA: Diagnosis not present

## 2021-08-19 DIAGNOSIS — M48062 Spinal stenosis, lumbar region with neurogenic claudication: Secondary | ICD-10-CM | POA: Diagnosis not present

## 2021-08-19 DIAGNOSIS — G9589 Other specified diseases of spinal cord: Secondary | ICD-10-CM | POA: Diagnosis not present

## 2021-08-19 DIAGNOSIS — R2689 Other abnormalities of gait and mobility: Secondary | ICD-10-CM | POA: Diagnosis not present

## 2021-12-19 DIAGNOSIS — R7303 Prediabetes: Secondary | ICD-10-CM | POA: Insufficient documentation

## 2022-02-16 DIAGNOSIS — Z79899 Other long term (current) drug therapy: Secondary | ICD-10-CM | POA: Insufficient documentation

## 2022-02-16 DIAGNOSIS — Z9181 History of falling: Secondary | ICD-10-CM | POA: Insufficient documentation

## 2022-02-16 DIAGNOSIS — N411 Chronic prostatitis: Secondary | ICD-10-CM | POA: Insufficient documentation

## 2022-02-16 DIAGNOSIS — R262 Difficulty in walking, not elsewhere classified: Secondary | ICD-10-CM | POA: Insufficient documentation

## 2022-05-05 DIAGNOSIS — N401 Enlarged prostate with lower urinary tract symptoms: Secondary | ICD-10-CM | POA: Insufficient documentation

## 2022-05-08 DIAGNOSIS — R399 Unspecified symptoms and signs involving the genitourinary system: Secondary | ICD-10-CM | POA: Insufficient documentation

## 2022-05-08 DIAGNOSIS — R300 Dysuria: Secondary | ICD-10-CM | POA: Insufficient documentation

## 2023-10-04 DIAGNOSIS — R1030 Lower abdominal pain, unspecified: Secondary | ICD-10-CM | POA: Insufficient documentation

## 2023-10-04 DIAGNOSIS — M48062 Spinal stenosis, lumbar region with neurogenic claudication: Secondary | ICD-10-CM | POA: Insufficient documentation

## 2024-05-03 DIAGNOSIS — H25813 Combined forms of age-related cataract, bilateral: Secondary | ICD-10-CM | POA: Insufficient documentation

## 2024-07-31 DIAGNOSIS — H35341 Macular cyst, hole, or pseudohole, right eye: Secondary | ICD-10-CM | POA: Insufficient documentation

## 2024-08-30 NOTE — Progress Notes (Signed)
 [employer] [facility] at [facility] Multiple Sclerosis/Neuroimmunology Division Ivan Lesta Ruse, MD Associate Professor of Neurology  DATE OF VISIT: 08/31/2024  Re: Austin Wagner [address] [place] [address] MRN: [id number] DOB: 05/20/1951  Visit: First Visit   REASON FOR VISIT: Austin Wagner, a 73 y.o. Hispanic right handed male, is seen in consultation at the [facility], Multiple Sclerosis/Neuroimmunology Division at the request of Cheerag Dipakkumar Upad*  for the evaluation of cervical myelopathy. This is the first evaluation of  Austin Wagner at the [facility], Multiple Sclerosis/Neuroimmunology Division. Interpreter service (online, video ) was used for this visit.   Assessment:   1. I took a detailed history of the present illness fromMr. [name] , details on past medical history, family history and social history. 2. I personally reviewed  patient's prior medical records, radiology reports, and laboratory work.   3. I personally reviewed the patient's prior MRI images  and have discussed  MRI findings with the patient.  4.  I performed medication reconciliation and neurological examination  5  Mr. Danton Palmateer completed the PHQ-9 depression questionnaire 6. The differential diagnosis and the plan for the diagnostic work-up were discussed in details with the patient. Mr. Cadyn Fann agreed with the recommended diagnostic plan 7. Neurological treatment plan will be discussed with the patient after work ups is completed.                                                                                                                                                  Cervical myelopathy: Austin Wagner, is a 73 y.o. Hispanic right handed male with a history of  has a past medical history of Anxiety (08/02/2013),  Back pain, chronic, Benign prostatic hypertrophy without lower urinary tract symptoms (LUTS) (08/14/2011), CAD (coronary artery disease), Cataract, Chest pain (08/02/2013), Depression, Dizziness, Dyspepsia (06/03/2014), GERD (gastroesophageal reflux disease) (05/11/2013), Headache (05/11/2013), HL (hearing loss), Hypertension, benign (06/17/2005), IBS (irritable bowel syndrome) (09/16/2014), Insomnia (08/02/2013), Intermediate coronary syndrome    (CMS-HCC) (11/15/2006), Lack of access to transportation, Macular hole of right eye, Major depressive disorder, recurrent episode, in full remission (06/17/2005), Mixed hyperlipidemia (06/17/2005), Night sweats (10/14/2019), Non-English speaking patient, Nosebleed, Numbness or tingling (11/27/2014), Osteoarthritis (06/17/2005), Otitis media, Personality disorder    (CMS-HCC) (04/02/2009), Right-sided low back pain without sciatica (03/02/2015), Sensory loss (11/27/2014), Severe obstructive sleep apnea, Testicular hypofunction (08/14/2011), Tinea cruris (05/11/2013), Tinnitus, and TMJ dysfunction. , who presents for the evaluation of MS.  The patient is a poor historian, bust most likely he started to have progressive walking difficulty around at least 2 years ago.   I personally reviewed MRI studies:performed on 06/20/2024-brain cervical and thoracic spine with and without contrast-brain MRI revealed brain atrophy and microangiopathic changes thoracic spine  MRI did not show any spinal cord lesions however cervical spine MRI study showed C4-C5 myelopathy and multilevel cervical degenerative disc disease without abnormal enhancement, suggestive of myelopathy.  We referred to explore causes of myelopathy with the workup as below.  Cervical spondylotic myelopathy, inflammatory and nutritional myopathies are on the differential.  Plan:   Laboratory tests  for the laboratory evaluation of possible causes of myelopathy Urine test today- given patient suspects UTI-  unrevealing We will schedule spinal tap for routine CSF+ CSF autoimmune myelopathy panel, CSF ACE, CSF soluble Il-2 receptor Referral for the pain spine clinic Follow up in 2 months, Dr. Rockney  Subjective:   HISTORY OF PRESENT ILLNESS: Austin Wagner, is a 73 y.o. Hispanic right handed male with a history of  has a past medical history of Anxiety (08/02/2013), Back pain, chronic, Benign prostatic hypertrophy without lower urinary tract symptoms (LUTS) (08/14/2011), CAD (coronary artery disease), Cataract, Chest pain (08/02/2013), Depression, Dizziness, Dyspepsia (06/03/2014), GERD (gastroesophageal reflux disease) (05/11/2013), Headache (05/11/2013), HL (hearing loss), Hypertension, benign (06/17/2005), IBS (irritable bowel syndrome) (09/16/2014), Insomnia (08/02/2013), Intermediate coronary syndrome    (CMS-HCC) (11/15/2006), Lack of access to transportation, Macular hole of right eye, Major depressive disorder, recurrent episode, in full remission (06/17/2005), Mixed hyperlipidemia (06/17/2005), Night sweats (10/14/2019), Non-English speaking patient, Nosebleed, Numbness or tingling (11/27/2014), Osteoarthritis (06/17/2005), Otitis media, Personality disorder    (CMS-HCC) (04/02/2009), Right-sided low back pain without sciatica (03/02/2015), Sensory loss (11/27/2014), Severe obstructive sleep apnea, Testicular hypofunction (08/14/2011), Tinea cruris (05/11/2013), Tinnitus, and TMJ dysfunction. , who presents for the evaluation of MS.  Current symptoms (chief complaints): Vision/double vision: eye surgery, denies double vision. He sees blurry on both eyes.  Speech, swallowing problems: no problems with Spanish, no toruble swallowing Weakness: yes in legs, and in both arms Fatigue: yes Tingling/numbness/pain: pain and itching in the whole body. Feet feel numb and right hands. Back pain. Balance/coordination problems: Does not feel dizzy. Reports dizzy spells Bowel/bladder control  problems: he has been having urgency with urination for years, does not report trouble emptying his bladder, urinates 2-5 times during the night. BM every day/  Memory, mood: Feels depressed,forgetful. More troubles remembering past events. Gait: walks with the walker, started to use the rolling walker about one year ago, prior to that he had a walker with wheels, 2 years ago he was using a cane.  3 years ago he was walking without assistance.  Falls: not in the last 6 months Headaches: Headaches 3-4 days a week. Like a band around the forehead. Sometimes would feel nausea, rare vomiting with headache. Seizures:  had 3 episode last possibly  one year ago, that he does not remember Other symptoms: Since a couple of weeks ago he as itching in arms and legs and on his head.   The last doctor told  after the MRI was back  there was nothing thy could do.   ............................................................................................................................................[facility] DIAGNOSTIC STUDIES / REVIEW OF RECORDS:  Prior medical records: Reviewed personally, please see assessment.   .............................................................................................................................................    Past Medical History: Past Medical History[1]  ALLERGIES: Allergies[2]  CURRENT MEDICATIONS: Current Medications[3]    Past Surgical History: Past Surgical History[4]  Social History: Social History[5]  Family History: Family History[6]   Review of Systems: A 10-systems review was performed and, unless otherwise noted, declared negative by patient.  Objective:   Physical Exam: Blood pressure 109/55, pulse 62, temperature 36.5 C (97.7 F), temperature source Temporal, resp. rate 18, height 157.5 cm (5' 2.02), weight  86.8 kg (191 lb 5 oz).  General Appearance: in no acute distress. Normal skin color, afebrile.  Heart and lungs:  Regular heart rate . Eupneic, normal respiratory rate, normal breath sounds. Abdomen: Soft, non-tender.No peripheral  edema, peripheral pulses palpable.    NEUROLOGICAL EXAMINATION:   General:  Alert and oriented to person, place, time and situation.   Language and spontaneous speech normal, no dysarthria or aphasia.   PHQ-9, 21, the last 2 weeks the patient does not report having thoughts that he would be better off dead or hurting himself in someway  Cranial Nerves:   II, III- Pupils are equal and reactive to light b/l (direct and consensual reactions).  III, IV, VI- extra ocular movements are intact, No ptosis, no diplopia, no nystagmus. Mild right semiptosis.  V- sensation of the face intact b/l. VII- face symmetrical, no facial droop, normal facial movements with smile/grimace VIII- Hearing grossly intact. Weber test: sound is symmetrical with no lateralization. IX and X- symmetric palate contraction,  no dysarthria, no dysphagia. XI- Full shoulder shrug bilaterally; no wasting, normal tone and strength of sternocleidomastoid muscles bilaterally. XII- No tongue atrophy, no tongue fasciculations; tongue protrudes midline, full range of movements of the tongue.  Neck flexion normal.  Motor Exam:   Normal bulk. Fasciculations not observed.   Muscle strength:  Muscles UEs  LEs   R L  R L  Deltoids 5/5 5/5 Hip flexors  5/5 5/5  Biceps 5/5 5/5 Hip extensors 5/5 5/5  Triceps 5/5 5/5 Knee flexors 5/5 5/5  Hand grip 5/5 5/5 Knee extensors 5/5 5/5  Wrist flexors 5/5 5/5 Foot dorsal flexors 5/5 5/5  Wrist extensors 5/5 5/5 Foot plantar flexors 5/5 5/5  Finger flexors 5/5 5/5     Finger extensors 5/5 5/5        McArdle's sign: negative    Reflexes R L  Biceps +3 +3  Brachioradialis  +3 +3  Triceps +3 +3  Patella +3 +3  Achilles +3 +3   Normal tone b/l.  Plantar response: Downgoing bilaterally Crossed adductor reflex positive bl  Sensory system: Superficial light touch  sensation:T4 level on the right for light touch. Vibration sense: normal in UEs, on LEs- lost up to both ankles bl where decreased Position sense: impaired at the level of right toe, wnl on left foot Pinprick test for pain sensation:WNL (WNL= within normal limits; UE= upper extremities; LE= lower extremities;       R= right, L= left).  Cerebellar/Coordination: No UE ataxia or intention tremor, Romberg positive/ tandem gait not tested due to fall risk.   Gait:  with a walker, mildly wide based.  .........................................................................................................................................SABRA  VISIT SUMMARY:  Mr. Kamil Mchaffie, a 73 y.o. male  presented for the evaluation of cervical myelopathy.   Mr. Naheem Mosco voiced a complete understanding of the diagnostic and treatment plan as detailed above. All questions were answered.   I personally spent 75 minutes face-to-face and non-face-to-face in the care of this patient, which includes all pre, intra, and post visit time on the date of service.  All documented time was specific to the E/M visit and does not include any procedures that may have been performed.  Thank you for the opportunity to contribute to the care of Mr. Oluwatomisin Deman.        [1] Past Medical History: Diagnosis Date  . Anxiety 08/02/2013  . Back pain, chronic   . Benign prostatic hypertrophy without lower urinary tract symptoms (LUTS)  08/14/2011  . CAD (coronary artery disease)   . Cataract   . Chest pain 08/02/2013  . Depression   . Dizziness   . Dyspepsia 06/03/2014  . GERD (gastroesophageal reflux disease) 05/11/2013  . Headache 05/11/2013  . HL (hearing loss)   . Hypertension, benign 06/17/2005  . IBS (irritable bowel syndrome) 09/16/2014  . Insomnia 08/02/2013  . Intermediate coronary syndrome    (CMS-HCC) 11/15/2006  . Lack of access to transportation    Due to health problems not  able to drive have to plan transportation  . Macular hole of right eye   . Major depressive disorder, recurrent episode, in full remission 06/17/2005  . Mixed hyperlipidemia 06/17/2005  . Night sweats 10/14/2019  . Non-English speaking patient   . Nosebleed   . Numbness or tingling 11/27/2014  . Osteoarthritis 06/17/2005  . Otitis media   . Personality disorder    (CMS-HCC) 04/02/2009  . Right-sided low back pain without sciatica 03/02/2015  . Sensory loss 11/27/2014  . Severe obstructive sleep apnea   . Testicular hypofunction 08/14/2011  . Tinea cruris 05/11/2013  . Tinnitus   . TMJ dysfunction   [2] Allergies Allergen Reactions  . Flomax [Tamsulosin] Dizziness  [3] Current Outpatient Medications  Medication Sig Dispense Refill  . albuterol  HFA 90 mcg/actuation inhaler Inhale 1 puff every four (4) hours as needed for wheezing. 8 g 11  . aspirin  (ECOTRIN) 81 MG tablet Take 1 tablet (81 mg total) by mouth daily. 90 tablet 3  . cyclobenzaprine (FLEXERIL) 5 MG tablet Take 1 tablet (5 mg total) by mouth two (2) times a day as needed for muscle spasms. 30 tablet 0  . diclofenac  sodium (VOLTAREN ) 1 % gel Apply 2 g topically four (4) times a day as needed for arthritis. 200 g 11  . finasteride (PROSCAR) 5 mg tablet Take 1 tablet (5 mg total) by mouth daily. 90 tablet 3  . fluticasone  propionate (FLONASE ) 50 mcg/actuation nasal spray Spray 1 spray en cada nariz todos los dias (Patient taking differently: as needed. Spray 1 spray en cada nariz todos los dias) 48 mL 2  . losartan  (COZAAR ) 50 MG tablet Take 1 tablet (50 mg total) by mouth daily. 30 tablet 11  . metoPROLOL  succinate (TOPROL -XL) 25 MG 24 hr tablet TAKE 1 TABLET (25 MG TOTAL) BY MOUTH DAILY. 90 tablet 3  . mirabegron (MYRBETRIQ) 25 mg Tb24 extended-release tablet Take 1 tablet (25 mg total) by mouth daily. 30 tablet 11  . naproxen (NAPROSYN) 500 MG tablet Take 1 tablet (500 mg total) by mouth two (2) times a day as needed (back  pain). 30 tablet 3  . NON FORMULARY PATIENT SUPPLIED MED Apply topically four (4) times a day as needed. 1 each 1  . omeprazole (PRILOSEC) 40 MG capsule TAKE 1 CAPSULE BY MOUTH EVERY DAY 90 capsule 3  . prednisoLONE acetate (PRED FORTE) 1 % ophthalmic suspension Administer 1 drop to the right eye four (4) times a day. 5 mL 1  . rosuvastatin  (CRESTOR ) 40 MG tablet Take 1 tablet (40 mg total) by mouth daily. 90 tablet 3  . spironolactone (ALDACTONE) 25 MG tablet TAKE 1 TABLET (25 MG TOTAL) BY MOUTH DAILY. 90 tablet 3  . sumatriptan (IMITREX) 50 MG tablet TOME UNA TABLETA (50 MG TOTAL) POR VIA ORAL EVERY 2 HOURS CUANDO SEA NECESARIO FOR MIGRANE (Patient taking differently: as needed.) 9 tablet 5  . traZODone (DESYREL) 50 MG tablet TAKE 1 TABLET BY MOUTH EVERY DAY AT  NIGHT 30 tablet 0  . cetirizine (ZYRTEC) 10 MG tablet Take 1 tablet (10 mg total) by mouth daily. 30 tablet 11  . clotrimazole (LOTRIMIN) 1 % cream Apply topically two (2) times a day. (Patient not taking: Reported on 08/31/2024) 28 g 0  . DULoxetine (CYMBALTA) 60 MG capsule Take 1 capsule (60 mg total) by mouth daily. (Patient not taking: Reported on 08/31/2024) 30 capsule 11  . GAVILAX 17 gram/dose powder MIX ONE CAPFUL (17GRAMS) INTO 6 TO 8 OUNCES LIQUID AND DRINK BY MOUTH EVERY DAY 255 g 0  . hydrOXYzine  (ATARAX ) 25 MG tablet Take 1 tablet (25 mg total) by mouth Three (3) times a day as needed. 270 tablet 1  . loperamide (IMODIUM) 2 mg capsule Take 1 capsule (2 mg total) by mouth four (4) times a day as needed for diarrhea. (Patient not taking: Reported on 08/31/2024) 30 capsule 0  . nitroglycerin (NITROSTAT) 0.4 MG SL tablet Place 1 tablet (0.4 mg total) under the tongue every five (5) minutes as needed. (Patient not taking: Reported on 08/31/2024) 25 tablet 3  . pramoxine 1 % Crea Apply to itchy areas on body two times daily 340 g 0  . trospium (SANCTURA) 20 mg tablet TAKE 1 TABLET BY MOUTH TWO TIMES A DAY. 180 tablet 3   No current  facility-administered medications for this visit.  [4] Past Surgical History: Procedure Laterality Date  . ABDOMINAL SURGERY Midline 2017   in [place], unclear indication, but he thinks his ED and other issues are much worse since then  . APPENDECTOMY  04/11/2016  . CORONARY ANGIOPLASTY WITH STENT PLACEMENT    . PR COLONOSCOPY FLX DX W/COLLJ SPEC WHEN PFRMD N/A 09/15/2018   Procedure: COLONOSCOPY, FLEXIBLE, PROXIMAL TO SPLENIC FLEXURE; DIAGNOSTIC, W/WO COLLECTION SPECIMEN BY BRUSH OR WASH;  Surgeon: Tawni CHRISTELLA First, MD;  Location: [facility];  Service: Gastroenterology  . PR UPPER GI ENDOSCOPY,BIOPSY N/A 08/27/2019   Procedure: UGI ENDOSCOPY; WITH BIOPSY, SINGLE OR MULTIPLE;  Surgeon: Suzen Nat Ferrier, MD;  Location: [facility];  Service: Gastroenterology  . PR VITRECTOMY PARS PLANA REMOVE INT MEMB RETINA Right 08/13/2024   Procedure: VITRECTOMY, MECHANICAL, PARS PLANA APPROACH; W/REMOVAL INTERNAL LIMITING MEMBRANE RETINA, INCL INTRAOC TAMP;  Surgeon: Odelia Madison Ovens, MD;  Location: [facility];  Service: Ophthalmology  . PR XCAPSL CTRC RMVL INSJ IO LENS PROSTH W/O ECP Right 05/29/2024   Procedure: EXTRACAPSULAR CATARACT REMOVAL W/INSERTION OF INTRAOCULAR LENS PROSTHESIS, MANUAL OR MECHANICAL TECHNIQUE WITHOUT ENDOSCOPIC CYCLOPHOTOCOAGULATION;  Surgeon: Sumner Consuelo Caldron, MD;  Location: [facility];  Service: Ophthalmology  [5] Social History Socioeconomic History  . Marital status: Married    Spouse name: Babs Gins  . Number of children: 9  Occupational History  . Occupation: disability  Tobacco Use  . Smoking status: Never  . Smokeless tobacco: Never  Vaping Use  . Vaping status: Never Used  Substance and Sexual Activity  . Alcohol use: No    Alcohol/week: 0.0 standard drinks of alcohol  . Drug use: No  . Sexual activity: Not Currently   Social Drivers of Health   Financial Resource Strain: Patient Unable To  Answer (02/18/2023)   Overall Financial Resource Strain (CARDIA)   . Difficulty of Paying Living Expenses: Patient unable to answer  Housing: Unknown (04/03/2021)   Housing   . Within the past 12 months, have you ever stayed: outside, in a car, in a tent, in an overnight shelter, or temporarily  in someone else's home (i.e. couch-surfing)?: No  [6] Family History Problem Relation Age of Onset  . No Known Problems Mother   . No Known Problems Father   . No Known Problems Sister   . No Known Problems Brother   . No Known Problems Maternal Aunt   . No Known Problems Maternal Uncle   . No Known Problems Paternal Aunt   . No Known Problems Paternal Uncle   . No Known Problems Maternal Grandmother   . No Known Problems Maternal Grandfather   . No Known Problems Paternal Grandmother   . No Known Problems Paternal Grandfather   . No Known Problems Other   . Sleep disorder Neg Hx   . Amblyopia Neg Hx   . Blindness Neg Hx   . Cancer Neg Hx   . Cataracts Neg Hx   . Diabetes Neg Hx   . Glaucoma Neg Hx   . Hypertension Neg Hx   . Macular degeneration Neg Hx   . Retinal detachment Neg Hx   . Strabismus Neg Hx   . Stroke Neg Hx   . Thyroid disease Neg Hx

## 2024-08-31 DIAGNOSIS — R7612 Nonspecific reaction to cell mediated immunity measurement of gamma interferon antigen response without active tuberculosis: Secondary | ICD-10-CM | POA: Insufficient documentation

## 2024-08-31 HISTORY — DX: Nonspecific reaction to cell mediated immunity measurement of gamma interferon antigen response without active tuberculosis: R76.12

## 2024-09-13 NOTE — Progress Notes (Signed)
 [facility] [place] [address] [place] [address] [phone]    [facility] AT THE  [facility] CLINIC NOTE    09/13/2024   PCP: Agatha Glennon HERO, MD    Assessment & Plan:    Mr. Austin Wagner is a 73 y.o. male who  has a past medical history of Anxiety (08/02/2013), Back pain, chronic, Benign prostatic hypertrophy without lower urinary tract symptoms (LUTS) (08/14/2011), CAD (coronary artery disease), Cataract, Chest pain (08/02/2013), Depression, Dizziness, Dyspepsia (06/03/2014), GERD (gastroesophageal reflux disease) (05/11/2013), Headache (05/11/2013), HL (hearing loss), Hypertension, benign (06/17/2005), IBS (irritable bowel syndrome) (09/16/2014), Insomnia (08/02/2013), Intermediate coronary syndrome    (CMS-HCC) (11/15/2006), Lack of access to transportation, Macular hole of right eye, Major depressive disorder, recurrent episode, in full remission (06/17/2005), Mixed hyperlipidemia (06/17/2005), Night sweats (10/14/2019), Non-English speaking patient, Nosebleed, Numbness or tingling (11/27/2014), Osteoarthritis (06/17/2005), Otitis media, Personality disorder    (CMS-HCC) (04/02/2009), Right-sided low back pain without sciatica (03/02/2015), Sensory loss (11/27/2014), Severe obstructive sleep apnea, Testicular hypofunction (08/14/2011), Tinea cruris (05/11/2013), Tinnitus, and TMJ dysfunction..    Assessment & Plan Possible acute bronchitis Cough and fatigue for 3-4 weeks with productive cough, wheezing, and shortness of breath. Wheezing not relieved by albuterol . Lungs clear on exam. O2 sat 98%. Patient in no acute distress. Differential includes bronchitis, pneumonia. Positive TB screening, awaiting follow-up with health department. - Order chest x-ray to evaluate for bronchitis. - Prescribe Augmentin 875 mg twice daily for 7 days. - Prescribe Benzonatate 100 mg three times daily as needed for cough. - Advise follow-up with Public  Health Department for positive TB screening.  Back pain with muscle spasms Chronic back pain with muscle spasms. Previously prescribed cyclobenzaprine, ran out of medication. - Prescribe Flexeril 5 mg twice daily as needed for muscle spasms and back pain.   Results LABS   Quantiferon TB Gold Plus: Positive (08/31/2024)  Chest XR: No acute airspace disease.  (09/13/2024)    Follow-up with PCP     *Patient note was created using Abridge  Any errors in syntax or even information may not have been identified and edited on initial review prior to signing this note.   Please let me know if there are any questions or concerns.   Sincerely, Starlyn SAILOR Currence   -----------------------------------------------------------  Subjective:   Chief Complaint  Patient presents with  . Infection    No appetite; fatigue; cough     History of Present Illness Austin Wagner is a 73 year old male who presents with symptoms of cough and fatigue.  Lower respiratory symptoms - Diagnosed with bronchitis approximately fifteen days ago at another clinic - Productive cough with phlegm - Wheezing in the chest - Shortness of breath occurring randomly, not associated with specific activities - Coughing up food and blood - No relief with albuterol  inhaler - No fever - No nasal congestion or sore throat  Fatigue and generalized weakness - Significant fatigue and lack of energy for three to four weeks, worsening over time - Difficulty getting out of bed - Lack of strength in the limbs  Upper respiratory symptoms - Occasional sneezing fits  Medication use - Albuterol  inhaler used without relief - No other medications taken for cough or fatigue   Please see HPI for ROS ----------------------------------------------------------- PAST MEDICAL HISTORY:  Past Medical History[1]  PAST SURGICAL HISTORY: Past Surgical History[2]  FAMILY HISTORY: Family  History[3]  ___________________________________ CURRENT MEDS: Current Medications[4]  ___________________________________ ALLERGIES: Allergies[5] ----------------------------------------------------------- Objective:  Vitals:   09/13/24 1721  BP: 110/70  Pulse: 68  Temp: 36.3 C (97.4 F)  TempSrc: Temporal  Weight: 86.8 kg (191 lb 5 oz)        Physical Exam Vitals and nursing note reviewed.  Constitutional:      General: He is not in acute distress.    Appearance: He is not ill-appearing.  Eyes:     General:        Right eye: No discharge.        Left eye: No discharge.  Cardiovascular:     Rate and Rhythm: Normal rate and regular rhythm.     Heart sounds: Normal heart sounds.  Pulmonary:     Effort: Pulmonary effort is normal.     Breath sounds: Normal breath sounds.  Skin:    General: Skin is warm and dry.  Neurological:     Mental Status: He is alert.          [facility]  at [facility] [address], [place], [place] [address] . Telephone [phone] . Fax [phone] [email or web address]         [1] Past Medical History: Diagnosis Date  . Anxiety 08/02/2013  . Back pain, chronic   . Benign prostatic hypertrophy without lower urinary tract symptoms (LUTS) 08/14/2011  . CAD (coronary artery disease)   . Cataract   . Chest pain 08/02/2013  . Depression   . Dizziness   . Dyspepsia 06/03/2014  . GERD (gastroesophageal reflux disease) 05/11/2013  . Headache 05/11/2013  . HL (hearing loss)   . Hypertension, benign 06/17/2005  . IBS (irritable bowel syndrome) 09/16/2014  . Insomnia 08/02/2013  . Intermediate coronary syndrome    (CMS-HCC) 11/15/2006  . Lack of access to transportation    Due to health problems not able to drive have to plan transportation  . Macular hole of right eye   . Major depressive disorder, recurrent episode, in full remission 06/17/2005  .  Mixed hyperlipidemia 06/17/2005  . Night sweats 10/14/2019  . Non-English speaking patient   . Nosebleed   . Numbness or tingling 11/27/2014  . Osteoarthritis 06/17/2005  . Otitis media   . Personality disorder    (CMS-HCC) 04/02/2009  . Right-sided low back pain without sciatica 03/02/2015  . Sensory loss 11/27/2014  . Severe obstructive sleep apnea   . Testicular hypofunction 08/14/2011  . Tinea cruris 05/11/2013  . Tinnitus   . TMJ dysfunction   [2] Past Surgical History: Procedure Laterality Date  . ABDOMINAL SURGERY Midline 2017   in [place], unclear indication, but he thinks his ED and other issues are much worse since then  . APPENDECTOMY  04/11/2016  . CORONARY ANGIOPLASTY WITH STENT PLACEMENT    . PR COLONOSCOPY FLX DX W/COLLJ SPEC WHEN PFRMD N/A 09/15/2018   Procedure: COLONOSCOPY, FLEXIBLE, PROXIMAL TO SPLENIC FLEXURE; DIAGNOSTIC, W/WO COLLECTION SPECIMEN BY BRUSH OR WASH;  Surgeon: Tawni CHRISTELLA First, MD;  Location: [facility];  Service: Gastroenterology  . PR UPPER GI ENDOSCOPY,BIOPSY N/A 08/27/2019   Procedure: UGI ENDOSCOPY; WITH BIOPSY, SINGLE OR MULTIPLE;  Surgeon: Suzen Nat Ferrier, MD;  Location: [facility] PROCED[facility];  Service: Gastroenterology  . PR VITRECTOMY PARS PLANA REMOVE INT MEMB RETINA Right 08/13/2024   Procedure: VITRECTOMY, MECHANICAL, PARS PLANA APPROACH; W/REMOVAL INTERNAL LIMITING MEMBRANE RETINA, INCL INTRAOC TAMP;  Surgeon: Odelia Madison Ovens, MD;  Location: [facility] PROCEDURES [facility];  Service: Ophthalmology  . PR  XCAPSL CTRC RMVL INSJ IO LENS PROSTH W/O ECP Right 05/29/2024   Procedure: EXTRACAPSULAR CATARACT REMOVAL W/INSERTION OF INTRAOCULAR LENS PROSTHESIS, MANUAL OR MECHANICAL TECHNIQUE WITHOUT ENDOSCOPIC CYCLOPHOTOCOAGULATION;  Surgeon: Sumner Consuelo Caldron, MD;  Location: [facility] PROC[facility];  Service: Ophthalmology  [3] Family History Problem Relation Age of Onset  . No Known Problems Mother   . No Known Problems  Father   . No Known Problems Sister   . No Known Problems Brother   . No Known Problems Maternal Aunt   . No Known Problems Maternal Uncle   . No Known Problems Paternal Aunt   . No Known Problems Paternal Uncle   . No Known Problems Maternal Grandmother   . No Known Problems Maternal Grandfather   . No Known Problems Paternal Grandmother   . No Known Problems Paternal Grandfather   . No Known Problems Other   . Sleep disorder Neg Hx   . Amblyopia Neg Hx   . Blindness Neg Hx   . Cancer Neg Hx   . Cataracts Neg Hx   . Diabetes Neg Hx   . Glaucoma Neg Hx   . Hypertension Neg Hx   . Macular degeneration Neg Hx   . Retinal detachment Neg Hx   . Strabismus Neg Hx   . Stroke Neg Hx   . Thyroid disease Neg Hx   [4] Current Outpatient Medications  Medication Sig Dispense Refill  . albuterol  HFA 90 mcg/actuation inhaler Inhale 1 puff every four (4) hours as needed for wheezing. 8 g 11  . aspirin  (ECOTRIN) 81 MG tablet Take 1 tablet (81 mg total) by mouth daily. 90 tablet 3  . cetirizine (ZYRTEC) 10 MG tablet Take 1 tablet (10 mg total) by mouth daily. 30 tablet 11  . cyclobenzaprine (FLEXERIL) 5 MG tablet Take 1 tablet (5 mg total) by mouth two (2) times a day as needed for muscle spasms. 30 tablet 0  . diclofenac  sodium (VOLTAREN ) 1 % gel Apply 2 g topically four (4) times a day as needed for arthritis. 200 g 11  . GAVILAX 17 gram/dose powder MIX ONE CAPFUL (17GRAMS) INTO 6 TO 8 OUNCES LIQUID AND DRINK BY MOUTH EVERY DAY 255 g 0  . hydrOXYzine  (ATARAX ) 25 MG tablet Take 1 tablet (25 mg total) by mouth Three (3) times a day as needed. 270 tablet 1  . losartan  (COZAAR ) 50 MG tablet Take 1 tablet (50 mg total) by mouth daily. 30 tablet 11  . metoPROLOL  succinate (TOPROL -XL) 25 MG 24 hr tablet TAKE 1 TABLET (25 MG TOTAL) BY MOUTH DAILY. 90 tablet 3  . mirabegron (MYRBETRIQ) 25 mg Tb24 extended-release tablet Take 1 tablet (25 mg total) by mouth daily. 30 tablet 11  . naproxen (NAPROSYN) 500  MG tablet Take 1 tablet (500 mg total) by mouth two (2) times a day as needed (back pain). 30 tablet 3  . NON FORMULARY PATIENT SUPPLIED MED Apply topically four (4) times a day as needed. 1 each 1  . omeprazole (PRILOSEC) 40 MG capsule TAKE 1 CAPSULE BY MOUTH EVERY DAY 90 capsule 3  . pramoxine 1 % Crea Apply to itchy areas on body two times daily 340 g 0  . prednisoLONE acetate (PRED FORTE) 1 % ophthalmic suspension Administer 1 drop to the right eye four (4) times a day. 5 mL 1  . rosuvastatin  (CRESTOR ) 40 MG tablet Take 1 tablet (40 mg total) by mouth daily. 90 tablet 3  . spironolactone (ALDACTONE) 25 MG  tablet TAKE 1 TABLET (25 MG TOTAL) BY MOUTH DAILY. 90 tablet 3  . traZODone (DESYREL) 50 MG tablet TAKE 1 TABLET BY MOUTH EVERY DAY AT NIGHT 30 tablet 0  . trospium (SANCTURA) 20 mg tablet TAKE 1 TABLET BY MOUTH TWO TIMES A DAY. 180 tablet 3   No current facility-administered medications for this visit.  [5] Allergies Allergen Reactions  . Flomax [Tamsulosin] Dizziness

## 2024-09-25 NOTE — Progress Notes (Signed)
 History of Full thickness macular hole right eye  S/P PPV+ILM peel and 24% SF6 gas right eye    6w post op. Doing well. Retina all flat, IOP WNL. All drops finished. Recheck 4 mos retina CAP

## 2024-09-28 NOTE — Progress Notes (Signed)
 [facility]  Patient ID: Austin Wagner is a 73 y.o. male who presents for follow up of nurse home visit and medication refills. PCP: Agatha Glennon HERO, MD    Informant: Patient came to appointment alone.  Assessment/Plan:    1. Need for COVID-19 vaccine (Primary) COVID-19 vaccine given today  2. Lower urinary tract symptoms (LUTS) - finasteride (PROSCAR) 5 mg tablet; Take 1 tablet (5 mg total) by mouth daily.  Dispense: 90 tablet; Refill: 3  3. Urgency of urination - trospium (SANCTURA) 20 mg tablet; Take 1 tablet (20 mg total) by mouth two (2) times a day.  Dispense: 180 tablet; Refill: 3  4. Absence of posterior tibial pulse - PVL Arterial Duplex Lower Extremity Left; Future  5. Chronic right-sided low back pain with right-sided sciatica - cyclobenzaprine (FLEXERIL) 5 MG tablet; Take 1 tablet (5 mg total) by mouth two (2) times a day as needed for muscle spasms.  Dispense: 30 tablet; Refill: 0  6. Severe episode of recurrent major depressive disorder, without psychotic features    (CMS-HCC) with difficulty sleeping - traZODone (DESYREL) 50 MG tablet; Take 1 tablet (50 mg total) by mouth nightly.  Dispense: 90 tablet; Refill: 0  7. Neuropathic pain - gabapentin (NEURONTIN) 100 MG capsule; Take 1 capsule (100 mg total) by mouth Three (3) times a day.  Dispense: 90 capsule; Refill: 2  8. Screening for Diabetes Mellitus -HgbA1C: 6.0 indicating Pre-diabetes.  -Start diet changes to improve this.   Follow/up in three months time or before that as needed.       Preventive services addressed today Influenza: ordered Covid vaccine/booster: ordered Diabetes screening: ordered  -- Patient verbalized an understanding of today's assessment and recommendations, as well as the purpose of ongoing medications.  I personally spent 35 minutes face-to-face and non-face-to-face in the care of this patient, which includes all pre, intra, and post visit time on the  date of service.  No follow-ups on file.  Subjective:   Chief Complaint  Patient presents with  . Follow-up  . Medication Refill    HPI. He has been feeling well. His mood is good. He has difficulty sleeping. Trazodone helped but he has run out of this medication. Had a nurse home visit from his insurance company, one abnormality found on PAD screening. He would like to follow/up on this. He would like influenza and COVID-19 vaccines today and to screen for diabetes.    ROS As per HPI.  RELEVANT PAST MEDICAL, FAMILY, SURGICAL, SOCIAL HISTORY Reviewed today ___________________________________ CURRENT MEDS Current Medications[1]  ___________________________________ ALLERGIES Allergies[2]   Objective:    Vital Signs BP 128/74 (BP Site: L Arm, BP Position: Sitting, BP Cuff Size: Large)   Pulse 60   Temp 36.2 C (97.1 F) (Temporal)   Ht 157.5 cm (5' 2.01) Comment: pt reported  Wt 87.6 kg (193 lb 3.2 oz)   BMI 35.33 kg/m    Exam Vitals reviewed.  Constitutional:      Appearance: Normal and healthy appearance. Well-developed, well-groomed and not in distress.  Pulmonary:     Effort: Pulmonary effort is normal.  Cardiovascular:     Normal rate. Regular rhythm. Normal S1. Normal S2.      Murmurs: There is no murmur.     No gallop.  No rub.  Pulses:    Dorsalis pedis: 1+ on the left side and 2+ on the right side.    Posterior tibial: 0 on the left side and 1+ on the  right side. Neurological:     Mental Status: Alert.  Psychiatric:        Attention and Perception: Attention and perception normal.        Mood and Affect: Affect normal.        Speech: Speech normal.        Behavior: Behavior normal. Behavior is cooperative.        Thought Content: Thought content normal.     Data Results for orders placed or performed in visit on 09/26/24  POCT Glycated Hemoglobin (HGB A1c)  Result Value Ref Range   HGB A1C, POC 6.0 <7.0 %   EST AVERAGE GLUCOSE, POC 126 mg/dL    *Note: Due to a large number of results and/or encounters for the requested time period, some results have not been displayed. A complete set of results can be found in Results Review.         [1] Current Outpatient Medications  Medication Sig Dispense Refill  . albuterol  HFA 90 mcg/actuation inhaler Inhale 1 puff every four (4) hours as needed for wheezing. 8 g 11  . aspirin  (ECOTRIN) 81 MG tablet Take 1 tablet (81 mg total) by mouth daily. 90 tablet 3  . benzonatate (TESSALON) 100 MG capsule Take 1 capsule (100 mg total) by mouth Three (3) times a day as needed. 30 capsule 0  . cetirizine (ZYRTEC) 10 MG tablet Take 1 tablet (10 mg total) by mouth daily. 30 tablet 11  . diclofenac  sodium (VOLTAREN ) 1 % gel Apply 2 g topically four (4) times a day as needed for arthritis. 200 g 11  . GAVILAX 17 gram/dose powder MIX ONE CAPFUL (17GRAMS) INTO 6 TO 8 OUNCES LIQUID AND DRINK BY MOUTH EVERY DAY 255 g 0  . hydrOXYzine  (ATARAX ) 25 MG tablet Take 1 tablet (25 mg total) by mouth Three (3) times a day as needed. 270 tablet 1  . losartan  (COZAAR ) 50 MG tablet Take 1 tablet (50 mg total) by mouth daily. 30 tablet 11  . metoPROLOL  succinate (TOPROL -XL) 25 MG 24 hr tablet TAKE 1 TABLET (25 MG TOTAL) BY MOUTH DAILY. 90 tablet 3  . naproxen (NAPROSYN) 500 MG tablet Take 1 tablet (500 mg total) by mouth two (2) times a day as needed (back pain). 30 tablet 3  . NON FORMULARY PATIENT SUPPLIED MED Apply topically four (4) times a day as needed. 1 each 1  . omeprazole (PRILOSEC) 40 MG capsule TAKE 1 CAPSULE BY MOUTH EVERY DAY 90 capsule 3  . pramoxine 1 % Crea Apply to itchy areas on body two times daily 340 g 0  . prednisoLONE acetate (PRED FORTE) 1 % ophthalmic suspension Administer 1 drop to the right eye four (4) times a day. 5 mL 1  . rosuvastatin  (CRESTOR ) 40 MG tablet Take 1 tablet (40 mg total) by mouth daily. 90 tablet 3  . spironolactone (ALDACTONE) 25 MG tablet TAKE 1 TABLET (25 MG TOTAL) BY MOUTH  DAILY. 90 tablet 3  . cyclobenzaprine (FLEXERIL) 5 MG tablet Take 1 tablet (5 mg total) by mouth two (2) times a day as needed for muscle spasms. 30 tablet 0  . finasteride (PROSCAR) 5 mg tablet Take 1 tablet (5 mg total) by mouth daily. 90 tablet 3  . gabapentin (NEURONTIN) 100 MG capsule Take 1 capsule (100 mg total) by mouth Three (3) times a day. 90 capsule 2  . traZODone (DESYREL) 50 MG tablet Take 1 tablet (50 mg total) by mouth nightly. 90 tablet 0  .  trospium (SANCTURA) 20 mg tablet Take 1 tablet (20 mg total) by mouth two (2) times a day. 180 tablet 3   No current facility-administered medications for this visit.  [2] Allergies Allergen Reactions  . Flomax [Tamsulosin] Dizziness

## 2024-10-16 NOTE — Progress Notes (Signed)
 VISIT DATE: 10/16/2024   CHIEF COMPLAINT:  Chief Complaint  Patient presents with  . Follow-up    After Neurology evaluation     History of Present Illness The patient is a 73 year old gentleman who returns today to have further questions answered.  He did have an evaluation by neurology which is ongoing.  He was found to have positive TB test and that evaluation is also ongoing.    Results Lumbar MRI reviewed notable for multilevel lumbar stenosis.   Physical Exam The patient is awake and alert.  He seems comfortable in the exam room.  He uses a rolling walker for ambulation.  He continues to have what sounds to me like slurred speech although perhaps this is just the way he talks as it did not seem to be that way from the perspective of his son.  Extraocular's are intact, face is symmetric, tongue is midline, hearing is grossly intact.  He seems to have good strength throughout.    Assessment & Plan The patient is a 73 year old gentleman with lumbar stenosis but who has a number of issues that are still being evaluated.  I am waiting for completion of the neurology workup for the changes noted in his cervical spinal cord.  The TB test was positive and this is also been referred by neurology for further evaluation.  Lastly the patient was noted to have depression and this is being treated by his primary care provider.  I explained to the son that while there may be a role for lumbar decompression it is important that we get this workup done as this would allow better sense of lastly the patient was noted to have depression and this is being treated by his primary care provider.  I explained to the son that while there may be a role for lumbar decompression it is important that we get this workup done as this would allow better sense of any potential issues that might declare themselves postoperatively.  For example is important that his depression be managed very well as surgery often goes  very nicely and uncomplicated; if there is a complication then it would be much better that his depression be optimally managed.  Furthermore I would need to better understand what is going on in terms of the positive TB test and if he needs treatment for that and lastly of course the completion of the neurology workup for the cord signal change noted in the cervical spinal cord.  Follow-up after the above.  Patient and son were understanding and appreciative of the conversation.    Weyman JONETTA Lessen, MD, MBA, MS, FAANS Minimally Invasive and Complex Spine Surgery Clinical Professor of Neurosurgery [facility] of Medicine

## 2024-10-17 NOTE — Progress Notes (Signed)
 ------------------------------------------------------------------------------- Attestation signed by Dawson Re, MD at 10/18/24 1331 I saw and evaluated the patient, participating in the key portions of the service.  I reviewed the resident/fellow's note.  I agree with the resident/fellow's findings and plan.   -- Re Dawson MD,[facility]  -------------------------------------------------------------------------------  Cornea Service  Subjective Patient ID: Austin Wagner is a 73 y.o. male.  Chief Complaint   Cataract Evaluation    HPI     Cataract Evaluation   In left eye.  Associated symptoms include blurred vision.  Severity is moderate.  Onset was gradual.  Duration of years.  Frequency is constant.  Context:  distance vision, mid-range vision and near vision.  Since onset it is gradually worsening.  Affected activities include daily activities.  Response to treatment was no improvement.        Comments   Spanish Interpreter [phone]  Eye drops: none   Presenting for cataract evaluation on left eye. Reports blurry vision that has worsened in left eye. Reports prescription glasses assists with with blurry vision would like new prescription. Reports not currently using eye drops or medications. Reports glare since cataract diagnosis has stayed stable. Would like surgery performed before traveling internationally in December.   No floaters, no curtains.         Last edited by Ellery Dayton SAUNDERS, MD on 10/17/2024  9:43 AM.     No current outpatient medications on file. (ANTI-ULCER PREPARATIONS)    Current Outpatient Medications (Other)  Medication Sig Dispense Refill  . albuterol  HFA 90 mcg/actuation inhaler Inhale 1 puff every four (4) hours as needed for wheezing. 8 g 11  . amoxicillin-clavulanate (AUGMENTIN) 875-125 mg per tablet Take 1 tablet by mouth two (2) times a day for 7 days. 14 tablet 0  . aspirin  (ECOTRIN) 81  MG tablet Take 1 tablet (81 mg total) by mouth daily. 90 tablet 3  . benzonatate (TESSALON) 100 MG capsule Take 1 capsule (100 mg total) by mouth Three (3) times a day as needed. 30 capsule 0  . cetirizine (ZYRTEC) 10 MG tablet Take 1 tablet (10 mg total) by mouth daily. 30 tablet 11  . cyclobenzaprine (FLEXERIL) 5 MG tablet Take 1 tablet (5 mg total) by mouth two (2) times a day as needed for muscle spasms. 30 tablet 0  . diclofenac  sodium (VOLTAREN ) 1 % gel Apply 2 g topically four (4) times a day as needed for arthritis. 200 g 11  . DULoxetine (CYMBALTA) 60 MG capsule Take 1 capsule (60 mg total) by mouth daily.    . finasteride (PROSCAR) 5 mg tablet Take 1 tablet (5 mg total) by mouth daily. 90 tablet 3  . gabapentin (NEURONTIN) 100 MG capsule Take 1 capsule (100 mg total) by mouth Three (3) times a day. 90 capsule 2  . GAVILAX 17 gram/dose powder MIX ONE CAPFUL (17GRAMS) INTO 6 TO 8 OUNCES LIQUID AND DRINK BY MOUTH EVERY DAY 255 g 0  . hydrOXYzine  (ATARAX ) 25 MG tablet Take 1 tablet (25 mg total) by mouth Three (3) times a day as needed. 270 tablet 1  . losartan  (COZAAR ) 50 MG tablet Take 1 tablet (50 mg total) by mouth daily. 30 tablet 11  . metoPROLOL  succinate (TOPROL -XL) 25 MG 24 hr tablet TAKE 1 TABLET (25 MG TOTAL) BY MOUTH DAILY. 90 tablet 3  . MYRBETRIQ 25 mg Tb24 extended-release tablet Take 1 tablet (25 mg total) by mouth daily.    . naproxen (NAPROSYN) 500 MG tablet  Take 1 tablet (500 mg total) by mouth two (2) times a day as needed (back pain). 30 tablet 3  . NON FORMULARY PATIENT SUPPLIED MED Apply topically four (4) times a day as needed. 1 each 1  . omeprazole (PRILOSEC) 40 MG capsule TAKE 1 CAPSULE BY MOUTH EVERY DAY 90 capsule 3  . pramoxine 1 % Crea Apply to itchy areas on body two times daily 340 g 0  . prednisoLONE acetate (PRED FORTE) 1 % ophthalmic suspension Administer 1 drop to the right eye four (4) times a day. 5 mL 1  . rosuvastatin  (CRESTOR ) 40 MG tablet Take 1  tablet (40 mg total) by mouth daily. 90 tablet 3  . traZODone (DESYREL) 50 MG tablet Take 1 tablet (50 mg total) by mouth nightly. 90 tablet 0  . trospium (SANCTURA) 20 mg tablet Take 1 tablet (20 mg total) by mouth two (2) times a day. 180 tablet 3  . spironolactone (ALDACTONE) 25 MG tablet TAKE 1 TABLET (25 MG TOTAL) BY MOUTH DAILY. (Patient not taking: Reported on 10/17/2024) 90 tablet 3    Objective Base Eye Exam     Visual Acuity (Snellen - Linear)       Right Left   Dist [place] 20/150 20/300 -1   Dist ph [place] NI NI         Tonometry ([facility], 8:57 AM)       Right Left   Pressure 10 10         Pupils       Shape React APD   Right Round Minimal None   Left Round Minimal None         Extraocular Movement       Right Left    Full Full         Neuro/Psych     Oriented x3: Yes   Mood/Affect: Normal         Dilation     Both eyes: 1% Tropicamide, 2.5% Phenylephrine @ 8:53 AM           Additional Tests     Keratometry (Automated)       K1 Axis K2 Axis   Right 43.25 2 43.75 92   Left 44.00 47 44.25 137           Slit Lamp and Fundus Exam     External Exam       Right Left   External Normal          Slit Lamp Exam       Right Left   Lids/Lashes reactive ptosis Normal   Conjunctiva/Sclera Pterygium [place]   Cornea 1+ PEE, tr corneal guttae 1+ PEE, tr corneal guttae   Anterior Chamber Formed, no hyphema. no hypopyon Deep and quiet   Iris Dilated Dilated   Lens PC IOL 2+ ns, 2+central PSC, vacuoles   Anterior Vitreous Normal          Fundus Exam       Right Left   Disc perfused    C/D Ratio 0.25 0.25   Macula looks flat    Vessels Normal    Periphery Flat and attached 360, Good laser barricade marks inferiorly around  the retinal tear.            Refraction     Manifest Refraction       Sphere Cylinder Axis Dist VA Add   Right [place] +0.50 015 20/100 +2.50   Left -5.50 +2.00 015 20/150 +2.50  Manifest  Refraction #2 (Auto)       Sphere Cylinder Axis Dist VA Add   Right -0.25 +0.25 042     Left -4.00 +0.75 027              Corneal Topography, IOL Screening - OU - Both Eyes       Laterality: both eyes. Patient cooperation was good.   Notes PERFORMED BY KADEDRIAH DAVIS      Orders Placed This Encounter  Procedures  . Corneal Topography, IOL Screening - OU - Both Eyes    Assessment/Plan: Austin Wagner presents today with a visually significant age-related combined cataract in the left eye, causing functional limitations in activities of daily living especially routine life. Best-corrected visual acuity (BCVA) is documented in the chart and is not significantly improved with refraction. The cataract is the likely cause of the patient's visual complaints, and removal is expected to improve functional vision.   Preoperative Considerations and Risks: I have thoroughly discussed with the patient that cataract surgery involves making a small incision in the eye, removing the cloudy lens using phacoemulsification, and replacing it with a clear plastic intraocular lens (IOL). Although rare, there is a 1-2% chance of a complication that might require extra care or follow-up. Special Risk Factors:  - Dominant Eye: right   - Contact Lens Wearer: no   - Diabetes: no  - Anticoagulant use: no   - Post-refractive surgery eyes: no   - Pseudoexfoliation (PXF):no   - Flomax or alpha-blocker use: no   - Trauma history:no    - Non-dilating pupil: no   - Patient can tolerate monitored anesthesia care (MAC) or topical anesthesia and lay supine for at least 10 minutes yes.  - Special Needs: none   - OCT macula status: normal       Procedure Planning:  - Planned phacoemulsification cataract extraction with IOL implantation in the left eye - Correction for far vision.   - Patient expressed desire for standard monofocal IOLs and understands the limitations and potential need for glasses  postoperatively.   Attending Ophthalmologist Certification of Medical Necessity - Cataract Surgery   1.   Present Conditions/Circumstances Meriting Lens extraction (check all that apply): Cataract is causing significant impairment of visual function not correctable with a tolerable change in glasses/contact lenses.  This impairment is resulting in specific activity limitations (eg. Reading, viewing television, driving)   2.   For visually-symptomatic cataract I certify as follows:   a.     This particular patient has expressed specific symptomatic impairment of visual function which has resulted in specific activity limitations and/or participation restrictions to include: participating in physical activity.   b.     This particular patient's impairment of visual function is not correctable with a tolerable change in glasses or contact lenses.   c.     Best- corrected Snellen visual acuity with glasses: Visual Acuity (Snellen - Linear)       Right Left   Dist [place] 20/150 20/300 -1   Dist ph [place] NI NI        Emery Peri, MD 10/17/2024, 10:08 AM   We will schedule the procedure at the patient's convenience. Preoperative clearance and biometry have been confirmed. Educational materials provided; patient encouraged to review them and bring any questions on surgery day. Written informed consent will be obtained on the day of surgery. Final lens (IOL) calculations will be re-verified on the day of surgery. If any unexpected findings arise during  the procedure--including the need for additional steps, management of complications, or other surgical maneuvers--they will be addressed appropriately. The patient will be counseled postoperatively regarding any such events.   I discussed the above assessment and plan with the patient. He had the opportunity to ask questions, and his questions and concerns were addressed. He was reminded to call if there is any significant change or worsening in  vision, or to get an evaluation, urgently if appropriate.  Emery Peri MD,[facility]

## 2024-11-05 ENCOUNTER — Ambulatory Visit

## 2024-11-05 DIAGNOSIS — R7612 Nonspecific reaction to cell mediated immunity measurement of gamma interferon antigen response without active tuberculosis: Secondary | ICD-10-CM

## 2024-11-05 NOTE — Progress Notes (Signed)
 Austin Wagner present during interview for Spanish language interpretation.  The patient is a 73 year old male screened for TB with QFT on 08/31/2024 as part of a workup for multiple sclerosis. His QFT was positive. His medical history includes cervical myelopathy, spinal stenosis of lumbar region with neurogenic claudication, coronary artery disease, diastolic heart failure, heart block AV complete, tachycardia-bradycardia syndrome, migraine headaches, prediabetes, difficulty walking with history of falls, depression, gastroesophageal reflux disease, hearing loss, chronic prostatitis, benign prostatic hypertrophy, hypertension, irritable bowel syndrome, mixed hyperlipidemia, osteoarthritis, and severe obstructive sleep apnea.  EPI completed today 11-05-2024. The patient reported history of night sweats and hemoptysis, but said he is not experiencing that recently. He does experience short bouts of chest pain and shortness of breath (none at the time of the visit) which he attributes to his coronary artery disease. He also has low appetite but eats anyway and has no weight loss. Cervical lymph node exam completed; cervical node palpated on the left side. He has noticed it but is not sure how long it has been there.  CXR order has been placed, no charge staff message sent to finance. Instructions have been given to patient including the address of outpatient radiology clinic at [facility], a map, an explanation that this is a walk-in appointment, and to NOT go to the Emergency Room as we cannot cover the cost of any imaging done in the ED, they must go to the outpatient radiology clinic.  Will follow up CXR and offer LTBI treatment pending results.   Austin LITTIE Primrose, RN

## 2024-11-08 ENCOUNTER — Ambulatory Visit
Admission: RE | Admit: 2024-11-08 | Discharge: 2024-11-08 | Disposition: A | Discharge status: Home / Self Care | Source: Ambulatory Visit | Attending: Surgery | Admitting: Surgery

## 2024-11-08 DIAGNOSIS — R7612 Nonspecific reaction to cell mediated immunity measurement of gamma interferon antigen response without active tuberculosis: Secondary | ICD-10-CM

## 2024-11-08 NOTE — Telephone Encounter (Addendum)
 Called patient via interpreter to inquire about IV or oral sedation for LP. LVM, HIPAA compliant.  Called patient via interpreter to inquire about IV or oral sedation for LP. Patient would like IV sedation. Routed to provider.

## 2024-11-12 ENCOUNTER — Ambulatory Visit: Payer: Self-pay | Admitting: Surgery

## 2024-11-12 ENCOUNTER — Encounter: Payer: Self-pay | Admitting: Surgery

## 2024-11-12 NOTE — Progress Notes (Signed)
 PATIENT NEEDS LYMPH NODE BIOPSY/EVAL BEFORE CAN BE TREATED FOR LTBI:  The patient is a 73 yo male being worked up for MS with mmp (difficulty walking, cervical myelopathy on MRI, chronic back pain/RL back pain, BPH, CAD, depression, GERD, HTN, hx Major depressive disorder, OSA, MO) with a recent positive +QFT 08/31/2024, past history of night sweats and hemoptysis (time frame unclear) with a normal CXR but palpable cervical lymph node on exam by [facility] provider on 11/05/2024. Patient needs LN biopsy before LTBI diagnosis can be confirmed, to rule out active TB.   Discussed normal CXR findings and likely LTBI diagnosis, but that we cannot proceed with treatment for LTBI until LN biopsy confirms no active TB. Patient is interested in treatment for LTBI but is also leaving for [place] in early December and is wondering if he can get procedure done before he leaves.   Patient confirmed that his PCP is at [facility], Almarie Mako, FNP. Will contact provider to see if patient needs to be seen in person before orders for biopsy can be placed, or can procedure be scheduled immediately.  Consulted with Dr. Selinda Chester at [facility], Director of TB Control for [place], and he confirmed CXR findings appeared normal but patient needs percutaneous needle biopsy, with specimens sent for cytology, AFB, and fungal cultures.    If AFB smears are negative will need to wait for AFB culture results (approx. 6 weeks) and if they are negative we can proceed with LTBI treatment.  Patient is awaiting further instructions/update on biopsy scheduling, will contact PCP to discuss.  PCP: [facility] Almarie Mako, [place] [phone]  Called above number and detailed message was taken by health care team member to be sent to Almarie Mako, FNP regarding LN biopsy and further workup.  HERLENE DELON HERO, MD

## 2024-12-26 ENCOUNTER — Telehealth: Payer: Self-pay

## 2024-12-28 NOTE — Telephone Encounter (Signed)
 Phone call to schedule appt for latent TB treatment. Unable to make connection with phone number listed. Unable to left a message. Patient was traveling to [place] 11/28/2024 - 01/08/2025. Will continue follow up.

## 2025-01-15 ENCOUNTER — Ambulatory Visit (LOCAL_COMMUNITY_HEALTH_CENTER)

## 2025-01-15 VITALS — Wt 192.0 lb

## 2025-01-15 DIAGNOSIS — R7612 Nonspecific reaction to cell mediated immunity measurement of gamma interferon antigen response without active tuberculosis: Secondary | ICD-10-CM

## 2025-01-15 NOTE — Progress Notes (Signed)
 Spanish interpreter Gladies T present during interview  Patient is a 74 yo male diagnosed with LTBI and would like to start treatment. He is unsure of his complete medication list.  Appt scheduled 01/17/2025 for initial TB med start.  He plans to bring all medication with him to this appt for a thorough medication review.  Dr. Herlene will review med list before ordering a LTBI treatment regimen.   LTBI vs Active TB disease was reviewed.  EPI reviewed; no new health concerns that he is aware of (see problem list for complete list).  He recently returned from [place] 01/08/2025 after staying >30 days.  He plans to travel back to [place] in March and will be away 2-3 weeks.    TB Flowsheet reviewed today.  Patient with positive side effect symptoms at baseline.  Will monitor for worsening symptoms during treatment.  Potential side effects discussed and patient drug information sheet (Rifampin ) given along with contact number.     Baseline labs drawn today (LFT's and CBC).

## 2025-01-16 ENCOUNTER — Encounter: Payer: Self-pay | Admitting: Surgery

## 2025-01-16 ENCOUNTER — Ambulatory Visit: Payer: Self-pay | Admitting: Surgery

## 2025-01-16 ENCOUNTER — Other Ambulatory Visit: Payer: Self-pay | Admitting: Surgery

## 2025-01-16 DIAGNOSIS — Z227 Latent tuberculosis: Secondary | ICD-10-CM | POA: Insufficient documentation

## 2025-01-16 LAB — HEPATIC FUNCTION PANEL
ALT: 18 IU/L (ref 0–44)
AST: 22 IU/L (ref 0–40)
Albumin: 4.5 g/dL (ref 3.8–4.8)
Alkaline Phosphatase: 80 IU/L (ref 47–123)
Bilirubin Total: 0.3 mg/dL (ref 0.0–1.2)
Bilirubin, Direct: 0.1 mg/dL (ref 0.00–0.40)
Total Protein: 6.7 g/dL (ref 6.0–8.5)

## 2025-01-16 LAB — CBC WITH DIFFERENTIAL/PLATELET
Basophils Absolute: 0 x10E3/uL (ref 0.0–0.2)
Basos: 1 %
EOS (ABSOLUTE): 0.2 x10E3/uL (ref 0.0–0.4)
Eos: 3 %
Hematocrit: 43.1 % (ref 37.5–51.0)
Hemoglobin: 14 g/dL (ref 13.0–17.7)
Immature Grans (Abs): 0 x10E3/uL (ref 0.0–0.1)
Immature Granulocytes: 0 %
Lymphocytes Absolute: 2.3 x10E3/uL (ref 0.7–3.1)
Lymphs: 27 %
MCH: 29.4 pg (ref 26.6–33.0)
MCHC: 32.5 g/dL (ref 31.5–35.7)
MCV: 90 fL (ref 79–97)
Monocytes Absolute: 0.8 x10E3/uL (ref 0.1–0.9)
Monocytes: 9 %
Neutrophils Absolute: 5.1 x10E3/uL (ref 1.4–7.0)
Neutrophils: 59 %
Platelets: 270 x10E3/uL (ref 150–450)
RBC: 4.77 x10E6/uL (ref 4.14–5.80)
RDW: 13.5 % (ref 11.6–15.4)
WBC: 8.5 x10E3/uL (ref 3.4–10.8)

## 2025-01-16 NOTE — Progress Notes (Signed)
 Language: __Spanish___  Patient is a 74 yo male diagnosed with LTBI.   Diagnosis of LTBI and pertinent labs/info: +QFT: 08/31/2024 CXR: negative 11/08/2024 EPI: 11/05/2024  HIV: neg 08/31/2024 Syphilis: 08/31/2024 CBC: wnl 01/15/2025 LFTs: wnl 01/15/2025   LTBI MED TX ORDERS:  Planned LTBI therapy start date: 01/17/2025 Therapy: Rifampin  600 mg daily for 4 months  Tuberculosis treatment orders:  All patients are to be monitored per [place] and county TB policies.   __[name]__ has latent TB. Treat for latent TB per the following:  Rifampin  600 mg daily by mouth x 4 months per Dr. Herlene.   ------------------------------------------------  START VISIT LAB ORDERS:  Obtain baseline CBC and LFTs. (Baseline labs were obtained 01/15/2025, all normal)  If baseline labs are normal, still needs monthly LFTs at each follow up appointment.   No need for monthly CBC unless patient reports symptoms of immunological reaction such as fevers, anemia (unusual fatigue, headache), and thrombocytopenia (bleeding/nosebleeds, easy bruising, petechiae, rash).   Offer HIV and syphilis at start visit, if declines HIV patient must sign waiver.     ------------------------------------------------ Language: __Spanish___  Patient is a 74 yo male diagnosed with LTBI.   Diagnosis of LTBI and pertinent labs/info: +QFT: 08/31/2024 CXR: negative 11/08/2024 EPI: 11/05/2024  HIV: neg 08/31/2024 Syphilis: 08/31/2024 CBC: wnl 01/15/2025 LFTs: wnl 01/15/2025   LTBI MED TX ORDERS:  Planned LTBI therapy start date: 01/17/2025 Therapy: Rifampin  600 mg daily for 4 months  Tuberculosis treatment orders  All patients are to be monitored per [place] and county TB policies.   __[name]__ has latent TB. Treat for latent TB per the following:  Rifampin  600 mg daily by mouth x 4 months per Dr. Herlene.   ------------------------------------------------  START VISIT LAB ORDERS: Obtain  baseline CBC and LFTs.   If baseline labs are normal, still needs monthly LFTs at each follow up appointment.   No need for monthly CBC unless patient reports symptoms of immunological reaction such as fevers, anemia (unusual fatigue, headache), and thrombocytopenia (bleeding/nosebleeds, easy bruising, petechiae, rash).   Offer HIV and syphilis at start visit, if declines HIV patient must sign waiver.     ------------------------------------------------  NOTE TO NURSING:  POTENTIAL DRUG INTERACTIONS: Rifampin  can decrease the effectiveness of the patient's: IMDUR  COZAAR  METOPROLOL  FINASTERIDE CYMBALTA TRAZODONE  Rifampin  can increase toxic metabolites for:  CRESTOR   All patients react differently to TB medications, and it is possible that the patient will not notice any side effects or decreased effectiveness of these medications.   However, if the patient notices any of the following, they should contact us  and potentially schedule follow up with their PCP for medication adjustments while taking rifampin :   Decreased effectiveness of anti-hypertensive med/_IMDUR, METOPROLOL  and COZAAR__: -Elevated blood pressure above their baseline, patient should monitor at home if possible -Headaches, blurry vision, dizziness, light headedness  Decreased effectiveness of BPH  medication__FINASTERIDE __: -Difficulty urinating, urinary hesitancy; frequent urination, weak or interrupted urine flow, incomplete emptying, UTIs  Decreased effectiveness of depression/anxietymedication__CYMBALTA__:  -Persistent feelings of sadness, loss of interest, tearfulness, trouble sleeping, increased anxiety, SI/HI  Decreased effectiveness of sleep medication__TRAZODONE__: -Difficulty falling asleep, difficulty staying asleep, early morning awakening  Patient is on a statin: -Patient needs monthly LFTs due to potential interaction between Crestor  and rifampin , causing increased liver enzymes/liver  toxicity.  USE OF OVER THE COUNTER PAIN MEDICINE:   The patient is taking acetaminophen  and/or aspirin . If the patient uses these medications on a regular/daily basis, the  patient will need monthly LFTs.  If patient declines LTBI tx, please educate patient as to the signs and symptoms of active TB and to contact the health department with any additional concerns or need for documentation.    HERLENE DELON HERO, MD

## 2025-01-17 ENCOUNTER — Ambulatory Visit

## 2025-01-17 DIAGNOSIS — Z227 Latent tuberculosis: Secondary | ICD-10-CM

## 2025-01-17 MED ORDER — RIFAMPIN 300 MG PO CAPS: 600.0000 mg | ORAL_CAPSULE | Freq: Every day | ORAL | Status: AC

## 2025-01-17 NOTE — Progress Notes (Signed)
 Dispensed Rifampin  300mg , #60 at today's visit 01/17/2025, instructed to take 2 capsules every day at approximately the same time of day.    Patient advised that their urine will likely change color to orange/red and that this is completely normal and not to worry.   Potential side effects discussed and patient drug information sheet given along with contact number. I provided counseling today regarding the medication. We discussed the medication, the side effects and when to call clinic. Patient was given the opportunity to ask questions.  Provided counseling on medication and answered pt questions. Reviewed proper administration of medication and advised pt to finish current bottle before starting the next.     Next appointment set for 02/14/2025 1045am  TB start letters sent to patients PCP and Neurologist via fax.

## 2025-02-14 ENCOUNTER — Encounter
